# Patient Record
Sex: Male | Born: 1975 | Race: White | Hispanic: No | Marital: Single | Smoking: Current every day smoker
Health system: Southern US, Community
[De-identification: ages and names within clinical notes are randomized; demographics above are authoritative.]

## PROBLEM LIST (undated history)

## (undated) DIAGNOSIS — G4730 Sleep apnea, unspecified: Secondary | ICD-10-CM

## (undated) DIAGNOSIS — F419 Anxiety disorder, unspecified: Secondary | ICD-10-CM

## (undated) DIAGNOSIS — G4700 Insomnia, unspecified: Secondary | ICD-10-CM

## (undated) DIAGNOSIS — K589 Irritable bowel syndrome without diarrhea: Secondary | ICD-10-CM

## (undated) DIAGNOSIS — K219 Gastro-esophageal reflux disease without esophagitis: Secondary | ICD-10-CM

## (undated) DIAGNOSIS — N289 Disorder of kidney and ureter, unspecified: Secondary | ICD-10-CM

## (undated) HISTORY — PX: ESOPHAGOGASTRODUODENOSCOPY ENDOSCOPY: SHX5814

---

## 1997-12-21 ENCOUNTER — Encounter: Payer: Self-pay | Admitting: Emergency Medicine

## 1997-12-21 ENCOUNTER — Emergency Department (HOSPITAL_COMMUNITY): Admission: EM | Admit: 1997-12-21 | Discharge: 1997-12-21 | Payer: Self-pay | Admitting: Emergency Medicine

## 1998-10-01 ENCOUNTER — Emergency Department (HOSPITAL_COMMUNITY): Admission: EM | Admit: 1998-10-01 | Discharge: 1998-10-01 | Payer: Self-pay | Admitting: *Deleted

## 1999-10-09 ENCOUNTER — Emergency Department (HOSPITAL_COMMUNITY): Admission: EM | Admit: 1999-10-09 | Discharge: 1999-10-09 | Payer: Self-pay

## 2003-04-15 ENCOUNTER — Emergency Department (HOSPITAL_COMMUNITY): Admission: EM | Admit: 2003-04-15 | Discharge: 2003-04-15 | Payer: Self-pay | Admitting: Emergency Medicine

## 2014-02-16 ENCOUNTER — Emergency Department (HOSPITAL_BASED_OUTPATIENT_CLINIC_OR_DEPARTMENT_OTHER): Payer: Self-pay

## 2014-02-16 ENCOUNTER — Emergency Department (HOSPITAL_BASED_OUTPATIENT_CLINIC_OR_DEPARTMENT_OTHER)
Admission: EM | Admit: 2014-02-16 | Discharge: 2014-02-16 | Disposition: A | Discharge status: Home / Self Care | Payer: Self-pay | Attending: Emergency Medicine | Admitting: Emergency Medicine

## 2014-02-16 ENCOUNTER — Encounter (HOSPITAL_BASED_OUTPATIENT_CLINIC_OR_DEPARTMENT_OTHER): Payer: Self-pay | Admitting: Emergency Medicine

## 2014-02-16 DIAGNOSIS — K5909 Other constipation: Secondary | ICD-10-CM | POA: Insufficient documentation

## 2014-02-16 DIAGNOSIS — Z720 Tobacco use: Secondary | ICD-10-CM | POA: Insufficient documentation

## 2014-02-16 DIAGNOSIS — K5904 Chronic idiopathic constipation: Secondary | ICD-10-CM

## 2014-02-16 DIAGNOSIS — R109 Unspecified abdominal pain: Secondary | ICD-10-CM

## 2014-02-16 LAB — URINALYSIS, ROUTINE W REFLEX MICROSCOPIC
Bilirubin Urine: NEGATIVE
Glucose, UA: NEGATIVE mg/dL
Hgb urine dipstick: NEGATIVE
Ketones, ur: NEGATIVE mg/dL
Leukocytes, UA: NEGATIVE
Nitrite: NEGATIVE
Protein, ur: NEGATIVE mg/dL
Specific Gravity, Urine: 1.015 (ref 1.005–1.030)
Urobilinogen, UA: 0.2 mg/dL (ref 0.0–1.0)
pH: 7.5 (ref 5.0–8.0)

## 2014-02-16 MED ORDER — GI COCKTAIL ~~LOC~~
30.0000 mL | Freq: Once | ORAL | Status: AC
  Administered 2014-02-16: 30 mL via ORAL
  Filled 2014-02-16: qty 30

## 2014-02-16 NOTE — ED Notes (Signed)
C/o pressure/discomfort  to left side between rib and waist x 1 week  Denies inj

## 2014-02-16 NOTE — ED Notes (Signed)
Patient states that he feels like he has "a blob" in his left upper and lower quad. Patient states that he feels "like I have to poop"

## 2014-02-16 NOTE — ED Provider Notes (Signed)
CSN: [id number]     Arrival date & time 02/16/14  0217 History   First MD Initiated Contact with Patient 02/16/14 0301     Chief Complaint  Patient presents with  . Abdominal Pain     (Consider location/radiation/quality/duration/timing/severity/associated sxs/prior Treatment) Patient is a 39 y.o. male presenting with abdominal pain. The history is provided by the patient.  Abdominal Pain Pain location:  LLQ Pain quality: bloating and cramping   Pain radiates to:  Does not radiate Pain severity:  Moderate Onset quality:  Gradual Timing:  Constant Progression:  Worsening Chronicity:  New Context: retching   Relieved by:  Nothing Worsened by:  Nothing tried Ineffective treatments:  None tried Associated symptoms: no diarrhea, no fever and no vomiting   Associated symptoms comment:  Small rock hard stools Risk factors: has not had multiple surgeries     History reviewed. No pertinent past medical history. History reviewed. No pertinent past surgical history. History reviewed. No pertinent family history. History  Substance Use Topics  . Smoking status: Current Every Day Smoker  . Smokeless tobacco: Not on file  . Alcohol Use: No    Review of Systems  Constitutional: Negative for fever.  Gastrointestinal: Positive for abdominal pain. Negative for vomiting and diarrhea.  All other systems reviewed and are negative.     Allergies  Review of patient's allergies indicates no known allergies.  Home Medications   Prior to Admission medications   Not on File   BP 133/91 mmHg  Pulse 66  Temp(Src) 97.9 F (36.6 C) (Oral)  Resp 20  SpO2 98% Physical Exam  Constitutional: He is oriented to person, place, and time. He appears well-developed and well-nourished. No distress.  HENT:  Head: Normocephalic and atraumatic.  Mouth/Throat: Oropharynx is clear and moist.  Eyes: Conjunctivae are normal. Pupils are equal, round, and reactive to light.  Neck: Normal range of  motion. Neck supple.  Cardiovascular: Normal rate, regular rhythm and intact distal pulses.   Pulmonary/Chest: Effort normal and breath sounds normal. No respiratory distress. He has no wheezes. He has no rales.  Abdominal: Soft. He exhibits no mass. Bowel sounds are increased. There is no tenderness. There is no rigidity, no rebound, no guarding, no tenderness at McBurney's point and negative Murphy's sign.  Stool palpable  Musculoskeletal: Normal range of motion.  Neurological: He is alert and oriented to person, place, and time.  Skin: Skin is warm.  Psychiatric: He has a normal mood and affect.    ED Course  Procedures (including critical care time) Labs Review Labs Reviewed  URINALYSIS, ROUTINE W REFLEX MICROSCOPIC    Imaging Review Dg Abd Acute W/chest  02/16/2014   CLINICAL DATA:  Pressure and discomfort to the left side between the rib and waist for 1 week.  EXAM: ACUTE ABDOMEN SERIES (ABDOMEN 2 VIEW & CHEST 1 VIEW)  COMPARISON:  Chest 10/31/2012  FINDINGS: There is no evidence of dilated bowel loops or free intraperitoneal air. No radiopaque calculi or other significant radiographic abnormality is seen. Heart size and mediastinal contours are within normal limits. Both lungs are clear.  IMPRESSION: Negative abdominal radiographs.  No acute cardiopulmonary disease.   Electronically Signed   By: Burman Nieves M.D.   On: 02/16/2014 03:34     EKG Interpretation None      MDM   Final diagnoses:  Abdominal pain, acute  Sound asleep in room on reevaluation  Gas throughout the colon and stool, some hard on acute abdominal. Will treat for  gas and constipation with colace and miralax.  Increase fiber in fiber and increased water.  Patient verbalizes understanding and agrees to follow up    Airianna Kreischer Smitty CordsK Kamiya Acord-Rasch, MD 02/16/14 [id number]

## 2014-02-16 NOTE — Discharge Instructions (Signed)

## 2014-06-23 ENCOUNTER — Emergency Department (HOSPITAL_BASED_OUTPATIENT_CLINIC_OR_DEPARTMENT_OTHER)
Admission: EM | Admit: 2014-06-23 | Discharge: 2014-06-23 | Disposition: A | Discharge status: Home / Self Care | Payer: Self-pay | Attending: Emergency Medicine | Admitting: Emergency Medicine

## 2014-06-23 ENCOUNTER — Encounter (HOSPITAL_BASED_OUTPATIENT_CLINIC_OR_DEPARTMENT_OTHER): Payer: Self-pay | Admitting: *Deleted

## 2014-06-23 DIAGNOSIS — R112 Nausea with vomiting, unspecified: Secondary | ICD-10-CM | POA: Insufficient documentation

## 2014-06-23 DIAGNOSIS — K5900 Constipation, unspecified: Secondary | ICD-10-CM | POA: Insufficient documentation

## 2014-06-23 DIAGNOSIS — R197 Diarrhea, unspecified: Secondary | ICD-10-CM | POA: Insufficient documentation

## 2014-06-23 DIAGNOSIS — Z720 Tobacco use: Secondary | ICD-10-CM | POA: Insufficient documentation

## 2014-06-23 LAB — BASIC METABOLIC PANEL
Anion gap: 9 (ref 5–15)
BUN: 12 mg/dL (ref 6–20)
CO2: 25 mmol/L (ref 22–32)
Calcium: 8.8 mg/dL — ABNORMAL LOW (ref 8.9–10.3)
Chloride: 102 mmol/L (ref 101–111)
Creatinine, Ser: 1.05 mg/dL (ref 0.61–1.24)
GFR calc Af Amer: >60 mL/min (ref >60)
GFR calc non Af Amer: >60 mL/min (ref >60)
Glucose, Bld: 124 mg/dL — ABNORMAL HIGH (ref 65–99)
Potassium: 3.5 mmol/L (ref 3.5–5.1)
Sodium: 136 mmol/L (ref 135–145)

## 2014-06-23 LAB — CBC WITH DIFFERENTIAL/PLATELET
Basophils Absolute: 0 10*3/uL (ref 0.0–0.1)
Basophils Relative: 0 % (ref 0–1)
Eosinophils Absolute: 0 10*3/uL (ref 0.0–0.7)
Eosinophils Relative: 0 % (ref 0–5)
HCT: 39.5 % (ref 39.0–52.0)
Hemoglobin: 14.2 g/dL (ref 13.0–17.0)
Lymphocytes Relative: 12 % (ref 12–46)
Lymphs Abs: 0.6 10*3/uL — ABNORMAL LOW (ref 0.7–4.0)
MCH: 31.3 pg (ref 26.0–34.0)
MCHC: 35.9 g/dL (ref 30.0–36.0)
MCV: 87 fL (ref 78.0–100.0)
Monocytes Absolute: 0.5 10*3/uL (ref 0.1–1.0)
Monocytes Relative: 10 % (ref 3–12)
Neutro Abs: 3.9 10*3/uL (ref 1.7–7.7)
Neutrophils Relative %: 78 % — ABNORMAL HIGH (ref 43–77)
Platelets: 191 10*3/uL (ref 150–400)
RBC: 4.54 MIL/uL (ref 4.22–5.81)
RDW: 12.3 % (ref 11.5–15.5)
WBC: 5 10*3/uL (ref 4.0–10.5)

## 2014-06-23 LAB — URINALYSIS, ROUTINE W REFLEX MICROSCOPIC
Glucose, UA: NEGATIVE mg/dL
Hgb urine dipstick: NEGATIVE
Ketones, ur: NEGATIVE mg/dL
Leukocytes, UA: NEGATIVE
Nitrite: NEGATIVE
Protein, ur: 30 mg/dL — AB
Specific Gravity, Urine: 1.04 — ABNORMAL HIGH (ref 1.005–1.030)
Urobilinogen, UA: 1 mg/dL (ref 0.0–1.0)
pH: 5.5 (ref 5.0–8.0)

## 2014-06-23 LAB — URINE MICROSCOPIC-ADD ON

## 2014-06-23 MED ORDER — HYDROMORPHONE HCL 1 MG/ML IJ SOLN
1.0000 mg | Freq: Once | INTRAMUSCULAR | Status: DC
  Filled 2014-06-23: qty 1

## 2014-06-23 MED ORDER — SODIUM CHLORIDE 0.9 % IV BOLUS (SEPSIS)
1000.0000 mL | Freq: Once | INTRAVENOUS | Status: AC
  Administered 2014-06-23: 1000 mL via INTRAVENOUS

## 2014-06-23 MED ORDER — ONDANSETRON HCL 4 MG PO TABS: 4.0000 mg | ORAL_TABLET | Freq: Four times a day (QID) | ORAL | Status: DC

## 2014-06-23 MED ORDER — ONDANSETRON HCL 4 MG/2ML IJ SOLN
4.0000 mg | Freq: Once | INTRAMUSCULAR | Status: AC
  Administered 2014-06-23: 4 mg via INTRAVENOUS
  Filled 2014-06-23: qty 2

## 2014-06-23 NOTE — Discharge Instructions (Signed)
Diarrhea °Diarrhea is watery poop (stool). It can make you feel weak, tired, thirsty, or give you a dry mouth (signs of dehydration). Watery poop is a sign of another problem, most often an infection. It often lasts 2-3 days. It can last longer if it is a sign of something serious. Take care of yourself as told by your doctor. °HOME CARE  °· Drink 1 cup (8 ounces) of fluid each time you have watery poop. °· Do not drink the following fluids: °¨ Those that contain simple sugars (fructose, glucose, galactose, lactose, sucrose, maltose). °¨ Sports drinks. °¨ Fruit juices. °¨ Whole milk products. °¨ Sodas. °¨ Drinks with caffeine (coffee, tea, soda) or alcohol. °· Oral rehydration solution may be used if the doctor says it is okay. You may make your own solution. Follow this recipe: °¨  - teaspoon table salt. °¨ ¾ teaspoon baking soda. °¨  teaspoon salt substitute containing potassium chloride. °¨ 1 tablespoons sugar. °¨ 1 liter (34 ounces) of water. °· Avoid the following foods: °¨ High fiber foods, such as raw fruits and vegetables. °¨ Nuts, seeds, and whole grain breads and cereals. °¨  Those that are sweetened with sugar alcohols (xylitol, sorbitol, mannitol). °· Try eating the following foods: °¨ Starchy foods, such as rice, toast, pasta, low-sugar cereal, oatmeal, baked potatoes, crackers, and bagels. °¨ Bananas. °¨ Applesauce. °· Eat probiotic-rich foods, such as yogurt and milk products that are fermented. °· Wash your hands well after each time you have watery poop. °· Only take medicine as told by your doctor. °· Take a warm bath to help lessen burning or pain from having watery poop. °GET HELP RIGHT AWAY IF:  °· You cannot drink fluids without throwing up (vomiting). °· You keep throwing up. °· You have blood in your poop, or your poop looks black and tarry. °· You do not pee (urinate) in 6-8 hours, or there is only a small amount of very dark pee. °· You have belly (abdominal) pain that gets worse or stays  in the same spot (localizes). °· You are weak, dizzy, confused, or light-headed. °· You have a very bad headache. °· Your watery poop gets worse or does not get better. °· You have a fever or lasting symptoms for more than 2-3 days. °· You have a fever and your symptoms suddenly get worse. °MAKE SURE YOU:  °· Understand these instructions. °· Will watch your condition. °· Will get help right away if you are not doing well or get worse. °Document Released: 07/16/2007 Document Revised: 06/13/2013 Document Reviewed: 10/05/2011 °ExitCare® Patient Information ©2015 [employer]. This information is not intended to replace advice given to you by your health care provider. Make sure you discuss any questions you have with your health care provider. ° °Nausea and Vomiting °Nausea means you feel sick to your stomach. Throwing up (vomiting) is a reflex where stomach contents come out of your mouth. °HOME CARE  °· Take medicine as told by your doctor. °· Do not force yourself to eat. However, you do need to drink fluids. °· If you feel like eating, eat a normal diet as told by your doctor. °¨ Eat rice, wheat, potatoes, bread, lean meats, yogurt, fruits, and vegetables. °¨ Avoid high-fat foods. °· Drink enough fluids to keep your pee (urine) clear or pale yellow. °· Ask your doctor how to replace body fluid losses (rehydrate). Signs of body fluid loss (dehydration) include: °¨ Feeling very thirsty. °¨ Dry lips and mouth. °¨ Feeling dizzy. °¨   Dark pee. °¨ Peeing less than normal. °¨ Feeling confused. °¨ Fast breathing or heart rate. °GET HELP RIGHT AWAY IF:  °· You have blood in your throw up. °· You have black or bloody poop (stool). °· You have a bad headache or stiff neck. °· You feel confused. °· You have bad belly (abdominal) pain. °· You have chest pain or trouble breathing. °· You do not pee at least once every 8 hours. °· You have cold, clammy skin. °· You keep throwing up after 24 to 48 hours. °· You have a  fever. °MAKE SURE YOU:  °· Understand these instructions. °· Will watch your condition. °· Will get help right away if you are not doing well or get worse. °Document Released: 07/16/2007 Document Revised: 04/21/2011 Document Reviewed: 06/28/2010 °ExitCare® Patient Information ©2015 [employer]. This information is not intended to replace advice given to you by your health care provider. Make sure you discuss any questions you have with your health care provider. ° °

## 2014-06-23 NOTE — ED Notes (Signed)
Patient reports pain is not bad enough to need dilaudid at present.  Patient refusing pain medication at this time.

## 2014-06-23 NOTE — ED Notes (Signed)
Pt sts he felt constipated yesterday so eat took some exlax. He sts that afterward, he began vomiting and with diarrhea. Today the diarrhea persists and he has a HA.

## 2014-06-23 NOTE — ED Provider Notes (Signed)
CSN: [id number]     Arrival date & time 06/23/14  2046 History  This chart was scribed for Raeford RazorStephen Scout Guyett, MD by Octavia HeirArianna Nassar, ED Scribe. This patient was seen in room MH02/MH02 and the patient's care was started at 10:30 PM.     Chief Complaint  Patient presents with  . Diarrhea    The history is provided by the patient. No language interpreter was used.     HPI Comments: Dalton Fuentes is a 39 y.o. male who presents to the Emergency Department complaining of intermittent diarrhea that began last night. Patient felt as if there was a brick in his chest and suspected he was constipated and took exlax for his symptoms and subsequently began vomiting and having diarrhea. Patient had associated diaphoresis and headaches. No aggravating or alleviating factors noted. Patient denies abdominal pain, urinary dysfunction, hematuria, bloody stools, changes in vision, numbness, and tingling sensation.    History reviewed. No pertinent past medical history. History reviewed. No pertinent past surgical history. No family history on file. History  Substance Use Topics  . Smoking status: Current Every Day Smoker  . Smokeless tobacco: Not on file  . Alcohol Use: No    Review of Systems  Eyes: Negative for visual disturbance.  Gastrointestinal: Positive for nausea, vomiting, diarrhea and constipation. Negative for abdominal pain and blood in stool.  Genitourinary: Negative for dysuria, hematuria and difficulty urinating.  Neurological: Negative for numbness.  All other systems reviewed and are negative.     Allergies  Review of patient's allergies indicates no known allergies.  Home Medications   Prior to Admission medications   Not on File   Triage vitals: BP 121/88 mmHg  Pulse 73  Temp(Src) 98.5 F (36.9 C) (Oral)  Resp 18  Ht 5\' 8"  (1.727 m)  Wt 175 lb (79.379 kg)  BMI 26.61 kg/m2  SpO2 100% Physical Exam  Constitutional: He appears well-developed and well-nourished. No  distress.  HENT:  Head: Normocephalic and atraumatic.  Eyes: Right eye exhibits no discharge. Left eye exhibits no discharge.  Cardiovascular: Normal rate, regular rhythm and normal heart sounds.   Pulmonary/Chest: Effort normal and breath sounds normal. No respiratory distress.  Abdominal: Soft. He exhibits no distension. There is no tenderness.  Neurological: He is alert. Coordination normal.  Skin: No rash noted. He is not diaphoretic.  Psychiatric: He has a normal mood and affect. His behavior is normal.  Nursing note and vitals reviewed.   ED Course  Procedures   DIAGNOSTIC STUDIES: Oxygen Saturation is 100% on RA, normal by my interpretation.  COORDINATION OF CARE: 10:33 PM Will order IV and blood work. Patient agrees.   Labs Review Labs Reviewed  URINALYSIS, ROUTINE W REFLEX MICROSCOPIC - Abnormal; Notable for the following:    Color, Urine AMBER (*)    APPearance CLOUDY (*)    Specific Gravity, Urine 1.040 (*)    Bilirubin Urine SMALL (*)    Protein, ur 30 (*)    All other components within normal limits  BASIC METABOLIC PANEL - Abnormal; Notable for the following:    Glucose, Bld 124 (*)    Calcium 8.8 (*)    All other components within normal limits  CBC WITH DIFFERENTIAL/PLATELET - Abnormal; Notable for the following:    Neutrophils Relative % 78 (*)    Lymphs Abs 0.6 (*)    All other components within normal limits  URINE MICROSCOPIC-ADD ON    Imaging Review No results found.   EKG Interpretation None  MDM   Final diagnoses:  Nausea vomiting and diarrhea    It has been determined that no acute conditions requiring further emergency intervention are present at this time. The patient has been advised of the diagnosis and plan. I reviewed any labs and imaging including any potential incidental findings. We have discussed signs and symptoms that warrant return to the ED and they are listed in the discharge instructions.   I personally preformed  the services scribed in my presence. The recorded information has been reviewed is accurate. Raeford RazorStephen Hilmar Moldovan, MD.     Raeford RazorStephen Orley Lawry, MD 06/28/14 [phone]

## 2014-06-23 NOTE — ED Notes (Signed)
MD at bedside. 

## 2015-07-09 ENCOUNTER — Encounter (HOSPITAL_BASED_OUTPATIENT_CLINIC_OR_DEPARTMENT_OTHER): Payer: Self-pay

## 2015-07-09 ENCOUNTER — Emergency Department (HOSPITAL_BASED_OUTPATIENT_CLINIC_OR_DEPARTMENT_OTHER)
Admission: EM | Admit: 2015-07-09 | Discharge: 2015-07-09 | Disposition: A | Discharge status: Home / Self Care | Payer: Self-pay | Attending: Emergency Medicine | Admitting: Emergency Medicine

## 2015-07-09 DIAGNOSIS — F410 Panic disorder [episodic paroxysmal anxiety] without agoraphobia: Secondary | ICD-10-CM | POA: Insufficient documentation

## 2015-07-09 DIAGNOSIS — F17200 Nicotine dependence, unspecified, uncomplicated: Secondary | ICD-10-CM | POA: Insufficient documentation

## 2015-07-09 MED ORDER — LORAZEPAM 1 MG PO TABS: 1.0000 mg | ORAL_TABLET | Freq: Every evening | ORAL | Status: DC | PRN

## 2015-07-09 NOTE — ED Notes (Signed)
Pt c/o intermittent trouble sleeping x 2 years-wakes up "in a panic and i can't sleep"-NAD-steady gait

## 2015-07-09 NOTE — ED Provider Notes (Signed)
CSN: [id number]     Arrival date & time 07/09/15  2223 History  By signing my name below, I, Dalton Fuentes, attest that this documentation has been prepared under the direction and in the presence of Dalton Libra, MD. Electronically Signed: Doreatha Fuentes, ED Scribe. 07/09/2015. 11:05 PM.     Chief Complaint  Patient presents with  . Insomnia   The history is provided by the patient. No language interpreter was used.    HPI Comments: Dalton Fuentes is a 40 y.o. male who presents to the Emergency Department complaining of intermittent insomnia for 2 years, most recently occurring tonight. Pt states typically he is able to sleep for approximately half an hour before waking up suddenly with a sense of impending doom. This sense of doom is severe enough to prevent him from wanting to go back to sleep. These episodes may last for several days and then abate for several months. He denies CP, SOB, hyperventilation, numbness, tingling or nausea upon waking up. He does notes he had one episode of emesis after waking up this evening, but states this is not typical. He denies any similar episodes during waking hours. There is no known trigger for these episodes. He never has them during waking hours. Per pt, he snores but is not sure if he has any apneic episodes while sleeping. He admits to marijuana use, but denies any additional drug use. Pt is not currently followed by a PCP.    History reviewed. No pertinent past medical history. No past surgical history on file. No family history on file. Social History  Substance Use Topics  . Smoking status: Current Every Day Smoker  . Smokeless tobacco: None  . Alcohol Use: Yes     Comment: occ    Review of Systems A complete 10 system review of systems was obtained and all systems are negative except as noted in the HPI and PMH.     Allergies  Review of patient's allergies indicates no known allergies.  Home Medications   Prior to Admission medications    Medication Sig Start Date End Date Taking? Authorizing Provider  LORazepam (ATIVAN) 1 MG tablet Take 1 tablet (1 mg total) by mouth at bedtime as needed for anxiety or sleep. 07/09/15   Dalton Polan, MD   BP 119/77 mmHg  Pulse 72  Temp(Src) 98.7 F (37.1 C) (Oral)  Resp 20  Ht  (1.727 m)  Wt 180 lb (81.647 kg)  BMI 27.38 kg/m2  SpO2 98%   Physical Exam  General: Well-developed, well-nourished male in no acute distress; appearance consistent with age of record HENT: normocephalic; atraumatic Eyes: pupils equal, round and reactive to light; extraocular muscles intact Neck: supple Heart: regular rate and rhythm Lungs: clear to auscultation bilaterally Abdomen: soft; nondistended; nontender; bowel sounds present Extremities: No deformity; full range of motion Neurologic: Awake, alert and oriented; motor function intact in all extremities and symmetric; no facial droop Skin: Warm and dry Psychiatric: Mildly anxious  ED Course  Procedures (including critical care time) DIAGNOSTIC STUDIES: Oxygen Saturation is 98% on RA, normal by my interpretation.    COORDINATION OF CARE: 11:01 PM Discussed treatment plan with pt at bedside which includes psychiatry f/u, rx for Ativan and pt agreed to plan.    MDM   Final diagnoses:  Panic attacks   I personally performed the services described in this documentation, which was scribed in my presence. The recorded information has been reviewed and is accurate.    Dalton Fuentes  Dalton Nygard, MD 07/09/15 2312

## 2015-07-09 NOTE — ED Notes (Signed)
EDP into room 

## 2015-07-09 NOTE — ED Notes (Signed)
No changes, no dyspnea noted, alert, NAD, calm

## 2015-07-09 NOTE — Discharge Instructions (Signed)
Panic Attacks °Panic attacks are sudden, short-lived surges of severe anxiety, fear, or discomfort. They may occur for no reason when you are relaxed, when you are anxious, or when you are sleeping. Panic attacks may occur for a number of reasons:  °· Healthy people occasionally have panic attacks in extreme, life-threatening situations, such as war or natural disasters. Normal anxiety is a protective mechanism of the body that helps us react to danger (fight or flight response). °· Panic attacks are often seen with anxiety disorders, such as panic disorder, social anxiety disorder, generalized anxiety disorder, and phobias. Anxiety disorders cause excessive or uncontrollable anxiety. They may interfere with your relationships or other life activities. °· Panic attacks are sometimes seen with other mental illnesses, such as depression and posttraumatic stress disorder. °· Certain medical conditions, prescription medicines, and drugs of abuse can cause panic attacks. °SYMPTOMS  °Panic attacks start suddenly, peak within 20 minutes, and are accompanied by four or more of the following symptoms: °· Pounding heart or fast heart rate (palpitations). °· Sweating. °· Trembling or shaking. °· Shortness of breath or feeling smothered. °· Feeling choked. °· Chest pain or discomfort. °· Nausea or strange feeling in your stomach. °· Dizziness, light-headedness, or feeling like you will faint. °· Chills or hot flushes. °· Numbness or tingling in your lips or hands and feet. °· Feeling that things are not real or feeling that you are not yourself. °· Fear of losing control or going crazy. °· Fear of dying. °Some of these symptoms can mimic serious medical conditions. For example, you may think you are having a heart attack. Although panic attacks can be very scary, they are not life threatening. °DIAGNOSIS  °Panic attacks are diagnosed through an assessment by your health care provider. Your health care provider will ask  questions about your symptoms, such as where and when they occurred. Your health care provider will also ask about your medical history and use of alcohol and drugs, including prescription medicines. Your health care provider may order blood tests or other studies to rule out a serious medical condition. Your health care provider may refer you to a mental health professional for further evaluation. °TREATMENT  °· Most healthy people who have one or two panic attacks in an extreme, life-threatening situation will not require treatment. °· The treatment for panic attacks associated with anxiety disorders or other mental illness typically involves counseling with a mental health professional, medicine, or a combination of both. Your health care provider will help determine what treatment is best for you. °· Panic attacks due to physical illness usually go away with treatment of the illness. If prescription medicine is causing panic attacks, talk with your health care provider about stopping the medicine, decreasing the dose, or substituting another medicine. °· Panic attacks due to alcohol or drug abuse go away with abstinence. Some adults need professional help in order to stop drinking or using drugs. °HOME CARE INSTRUCTIONS  °· Take all medicines as directed by your health care provider.   °· Schedule and attend follow-up visits as directed by your health care provider. It is important to keep all your appointments. °SEEK MEDICAL CARE IF: °· You are not able to take your medicines as prescribed. °· Your symptoms do not improve or get worse. °SEEK IMMEDIATE MEDICAL CARE IF:  °· You experience panic attack symptoms that are different than your usual symptoms. °· You have serious thoughts about hurting yourself or others. °· You are taking medicine for panic attacks and   have a serious side effect. MAKE SURE YOU:  Understand these instructions.  Will watch your condition.  Will get help right away if you are not  doing well or get worse.   This information is not intended to replace advice given to you by your health care provider. Make sure you discuss any questions you have with your health care provider.   Document Released: 01/27/2005 Document Revised: 02/01/2013 Document Reviewed: 09/10/2012 Elsevier Interactive Patient Education 2016 [employer] 0

## 2015-07-09 NOTE — ED Notes (Signed)
Recent and episodic sleep disturbances decribed as waking in a panic, and afraid to go back to sleep. (denies: physical sx), alert, NAD,calm, interactive.

## 2015-08-22 ENCOUNTER — Emergency Department (HOSPITAL_BASED_OUTPATIENT_CLINIC_OR_DEPARTMENT_OTHER)
Admission: EM | Admit: 2015-08-22 | Discharge: 2015-08-22 | Disposition: A | Discharge status: Home / Self Care | Payer: Self-pay | Attending: Emergency Medicine | Admitting: Emergency Medicine

## 2015-08-22 ENCOUNTER — Encounter (HOSPITAL_BASED_OUTPATIENT_CLINIC_OR_DEPARTMENT_OTHER): Payer: Self-pay | Admitting: *Deleted

## 2015-08-22 DIAGNOSIS — K0889 Other specified disorders of teeth and supporting structures: Secondary | ICD-10-CM

## 2015-08-22 DIAGNOSIS — F17210 Nicotine dependence, cigarettes, uncomplicated: Secondary | ICD-10-CM | POA: Insufficient documentation

## 2015-08-22 DIAGNOSIS — K0401 Reversible pulpitis: Secondary | ICD-10-CM

## 2015-08-22 DIAGNOSIS — K029 Dental caries, unspecified: Secondary | ICD-10-CM | POA: Insufficient documentation

## 2015-08-22 MED ORDER — PENICILLIN V POTASSIUM 250 MG PO TABS
500.0000 mg | ORAL_TABLET | Freq: Once | ORAL | Status: AC
  Administered 2015-08-22: 500 mg via ORAL
  Filled 2015-08-22: qty 2

## 2015-08-22 MED ORDER — KETOROLAC TROMETHAMINE 30 MG/ML IJ SOLN
30.0000 mg | Freq: Once | INTRAMUSCULAR | Status: AC
  Administered 2015-08-22: 30 mg via INTRAMUSCULAR
  Filled 2015-08-22: qty 1

## 2015-08-22 MED ORDER — PENICILLIN V POTASSIUM 500 MG PO TABS: 500.0000 mg | ORAL_TABLET | Freq: Four times a day (QID) | ORAL | Status: AC

## 2015-08-22 NOTE — ED Notes (Signed)
Pt c/o dental pain x 1 day  

## 2015-08-22 NOTE — ED Provider Notes (Signed)
CSN: [id number]     Arrival date & time 08/22/15  2116 History  By signing my name below, I, Dalton Fuentes, attest that this documentation has been prepared under the direction and in the presence of Laurence Spates, MD. Electronically Signed: Rosario Fuentes, ED Scribe. 08/22/2015. 10:46 PM.   Chief Complaint  Patient presents with  . Dental Pain   The history is provided by the patient. No language interpreter was used.   HPI Comments: Dalton Fuentes is a 40 y.o. male who presents to the Emergency Department complaining of gradual onset, gradually worsening, constant lower right-sided dental pain x 1 day. He has associated facial swelling to the area of pain. Pt has a hx of similar dental pain on the opposite side of his mouth ~8 months ago, and received a Penicillin shot from UC which completely resolved his symptoms. His pain is worsened chewing and palpation. He took 3-4 doses of his left over Amoxicillin over the past couple of days with no relief of his symptoms. He has also been taking  Ibuprofen as well with no relief of his pain. He is not currently followed by a dentist. Pt smokes 1 pack per three days. He denies fever, vomiting, nausea, or any other symptoms.  History reviewed. No pertinent past medical history. History reviewed. No pertinent past surgical history. History reviewed. No pertinent family history. Social History  Substance Use Topics  . Smoking status: Current Every Day Smoker -- 0.50 packs/day    Types: Cigarettes  . Smokeless tobacco: None  . Alcohol Use: Yes     Comment: occ    Review of Systems A complete 10 system review of systems was obtained and all systems are negative except as noted in the HPI and PMH.   Allergies  Review of patient's allergies indicates no known allergies.  Home Medications   Prior to Admission medications   Medication Sig Start Date End Date Taking? Authorizing Provider  LORazepam (ATIVAN) 1 MG tablet  Take 1 tablet (1 mg total) by mouth at bedtime as needed for anxiety or sleep. 07/09/15   John Molpus, MD  penicillin v potassium (VEETID) 500 MG tablet Take 1 tablet (500 mg total) by mouth 4 (four) times daily. 08/22/15 08/29/15  Ambrose Finland Hollee Fate, MD   BP 110/63 mmHg  Pulse 72  Temp(Src) 97.9 F (36.6 C)  Resp 16  Ht  (1.727 m)  Wt 180 lb (81.647 kg)  BMI 27.38 kg/m2  SpO2 97%   Physical Exam  Constitutional: He is oriented to person, place, and time. He appears well-developed and well-nourished. No distress.  HENT:  Head: Normocephalic and atraumatic.  Nose: Nose normal.  Mouth/Throat: Oropharynx is clear and moist.    Tenderness of R lower teeth with multiple teeth broken to gumline and multiple caries; no mucosal swelling or fluctuance  Eyes: Conjunctivae are normal.  Neck: Neck supple.  Lymphadenopathy:    He has no cervical adenopathy.  Neurological: He is alert and oriented to person, place, and time.  Skin: Skin is warm and dry.  Psychiatric: He has a normal mood and affect. Judgment normal.  Nursing note and vitals reviewed.  ED Course  Procedures (including critical care time)  DIAGNOSTIC STUDIES: Oxygen Saturation is 98% on RA, normal by my interpretation.   COORDINATION OF CARE: 10:45 PM-Discussed next steps with pt including antibiotics, a Toradol shot, and a recommendation for a f/u w/ a dental specialist. Pt verbalized understanding and is agreeable with  the plan.   MDM   Final diagnoses:  Pulpitis  Pain, dental   Patient with 1 day of dental pain, multiple broken teeth and dental caries on exam. No mucosal swelling  or fluctuance to suggest abscess. No obvious facial swelling. Gave penicillin, shot of Toradol, and emphasized importance of following with a dentist as soon as possible. Reviewed return precautions including significant facial swelling, worsening for pain, or fever. Patient voiced understanding.  I personally performed the services  described in this documentation, which was scribed in my presence. The recorded information has been reviewed and is accurate.    Laurence Spatesachel Morgan Ioana Louks, MD 08/23/15 [phone]

## 2015-08-22 NOTE — Discharge Instructions (Signed)
Community Resource Guide Dental °The [employer]’s “211” is a great source of information about community services available.  Access by dialing 2-1-1 from anywhere in [place], or by website -  www.nc211.org.  ° °Other Local Resources (Updated 02/2015) ° °Dental  Care °  °Services ° °  °Phone Number and Address  °Cost  °[facility] For children 0 - 42 years of age:  °• Cleaning °• Tooth brushing/flossing instruction °• Sealants, fillings, crowns °• Extractions °• Emergency treatment  [phone] °[address] °[place], [place] [address] Charges based on family income.  Medicaid and some insurance plans accepted.   °  °[facility] [place] • Cleaning °• Sealants, fillings, crowns °• Extractions °• Emergency treatment [phone] °[address] °[place], [place] ° Pregnant women 18 years of age or older with a Medicaid card  °[facility] [place] • Cleaning °• Sealants, fillings, crowns °• Extractions °• Emergency treatment [phone] °[address] °[place], [place] Pregnant women 18 years of age or older with a Medicaid card  °[facility] For children 0 - 42 years of age:  °• Cleaning °• Tooth brushing/flossing instruction °• Sealants, fillings, crowns °• Extractions °• Emergency treatment °Limited orthodontic services for patients with Medicaid [phone] °[address] °[place], [place] [address] Medicaid and [place] Health Choice cover for children up to age 42 and pregnant women.  Parents of children up to age 42 without Medicaid pay a reduced fee at time of service.  °[facility] [place] For children 0 - 42 years of age:  °• Cleaning °• Tooth brushing/flossing instruction °• Sealants, fillings, crowns °• Extractions °• Emergency treatment °Limited orthodontic services for patients with Medicaid  [phone] °[address] °[place], [place].  Medicaid and [place] Health Choice cover for children up to age 42 and pregnant women.  Parents of children up to age 42 without Medicaid pay a reduced fee.  °[facility] • Cleaning °• Sealants, fillings, crowns °• Extractions ° °Hours: Tuesdays and Thursdays, 4:15 - 8 pm [phone] °[address] °[place], [place] [address] Services free of charge to  County residents ages 18-64 who do not have health insurance, Medicare, Medicaid, or VA benefits and fall within federal poverty guidelines  °[facility] ° ° ° Provides dental care in addition to primary medical care, nutritional counseling, and pharmacy: °• Cleaning °• Sealants, fillings, crowns °• Extractions ° ° ° ° ° ° ° ° ° ° ° ° ° ° ° ° ° [phone] °[facility], [address] °[place], [place] ° °[phone] °[facility], [address] [place], [place] ° °[phone] °[facility] °[place], [place] ° °[phone] °[facility], [address] °[place], [place] ° °[phone] °[facility] °[address] °[place], [place] Accepts Medicaid, Medicare, most insurance.  Also provides services available to all with fees adjusted based on ability to pay.    °[facility] • Cleaning °• Tooth brushing/flossing instruction °• Sealants, fillings, crowns °• Extractions °• Emergency treatment °Hours: Tuesdays, Thursdays, and Fridays from 8 am to 5 pm by appointment only. [phone] °[address] °[place], [place] [address] [place] residents with Medicaid (depending on eligibility) and children with [place] Health Choice - call for more information.  °  [facility] • Extractions only ° °Hours: 2nd and 4th Thursday of each month from 6:30 am - 9 am.   [phone] ext. [phone] °[address] °[place], [place] [address] Ages 18 and older only.  Patients are seen on a first come, first served basis.  °[facility] • Cleanings °• Fillings °• Extractions °• Orthodontics °• Endodontics °• Implants/Crowns/Bridges °• Complete and partial dentures [phone] °[place], [place] Patients must complete an application for services.  There is often a waiting list.   ° °

## 2015-12-02 ENCOUNTER — Emergency Department (HOSPITAL_BASED_OUTPATIENT_CLINIC_OR_DEPARTMENT_OTHER): Payer: Self-pay

## 2015-12-02 ENCOUNTER — Emergency Department (HOSPITAL_BASED_OUTPATIENT_CLINIC_OR_DEPARTMENT_OTHER)
Admission: EM | Admit: 2015-12-02 | Discharge: 2015-12-03 | Disposition: A | Discharge status: Home / Self Care | Payer: Self-pay | Attending: Emergency Medicine | Admitting: Emergency Medicine

## 2015-12-02 ENCOUNTER — Encounter (HOSPITAL_BASED_OUTPATIENT_CLINIC_OR_DEPARTMENT_OTHER): Payer: Self-pay | Admitting: Emergency Medicine

## 2015-12-02 DIAGNOSIS — K5900 Constipation, unspecified: Secondary | ICD-10-CM | POA: Insufficient documentation

## 2015-12-02 DIAGNOSIS — F17210 Nicotine dependence, cigarettes, uncomplicated: Secondary | ICD-10-CM | POA: Insufficient documentation

## 2015-12-02 LAB — URINALYSIS, ROUTINE W REFLEX MICROSCOPIC
Bilirubin Urine: NEGATIVE
Glucose, UA: NEGATIVE mg/dL
Hgb urine dipstick: NEGATIVE
KETONES UR: NEGATIVE mg/dL
LEUKOCYTES UA: NEGATIVE
Nitrite: NEGATIVE
PROTEIN: NEGATIVE mg/dL
Specific Gravity, Urine: 1.013 (ref 1.005–1.030)
pH: 6 (ref 5.0–8.0)

## 2015-12-02 MED ORDER — ONDANSETRON 8 MG PO TBDP
8.0000 mg | ORAL_TABLET | Freq: Once | ORAL | Status: AC
  Administered 2015-12-02: 8 mg via ORAL
  Filled 2015-12-02: qty 1

## 2015-12-02 MED ORDER — DICYCLOMINE HCL 10 MG/ML IM SOLN
20.0000 mg | Freq: Once | INTRAMUSCULAR | Status: AC
  Administered 2015-12-02: 20 mg via INTRAMUSCULAR
  Filled 2015-12-02: qty 2

## 2015-12-02 MED ORDER — GI COCKTAIL ~~LOC~~
30.0000 mL | Freq: Once | ORAL | Status: AC
  Administered 2015-12-02: 30 mL via ORAL
  Filled 2015-12-02: qty 30

## 2015-12-02 NOTE — ED Triage Notes (Signed)
Pt in c/o abd distension, bloating, and nausea x 2-3 hours. Is passing gas, has had two small formed BMs since feeling bad without relief. States is nauseous but has not vomited. Alert, interactive, ambulatory in NAD.

## 2015-12-02 NOTE — ED Provider Notes (Signed)
[facility]Y [facility]PT [facility] Provider Note   CSN: [id number] Arrival date & time: 12/02/15  2302  By signing my name below, I, Teofilo Pod, attest that this documentation has been prepared under the direction and in the presence of Lelaina Oatis, MD . Electronically Signed: Teofilo Pod, ED Scribe. 12/02/2015. 11:14 PM.   History   Chief Complaint Chief Complaint  Patient presents with  . Bloated  . Nausea    The history is provided by the patient. No language interpreter was used.  Abdominal Pain   This is a new problem. The current episode started 1 to 2 hours ago. The problem occurs constantly. The problem has not changed since onset.The pain is associated with eating. The pain is located in the LLQ (bloating and cramping). The pain is mild. Associated symptoms include flatus and constipation. Pertinent negatives include anorexia, fever, diarrhea, vomiting, dysuria, frequency and hematuria. Associated symptoms comments: Small, hard stools. Nothing aggravates the symptoms. Nothing relieves the symptoms. Past workup does not include GI consult. His past medical history does not include PUD, ulcerative colitis or Crohn's disease.   HPI Comments:  Dalton Fuentes is a 40 y.o. male who presents to the Emergency Department complaining of constant LLQ pain that began 2 hours ago. Pt describes the pain as "bloating and cramping." Pt reports associated small, hard stool today, but yesterday his stool was normal. Pt states that he ate sausage biscuits and pizza today. No alleviating factors noted. Pt denies dysuria, blood in stool, fever, congestion, sore throat.  History reviewed. No pertinent past medical history.  There are no active problems to display for this patient.   History reviewed. No pertinent surgical history.     Home Medications    Prior to Admission medications   Medication Sig Start Date End Date Taking? Authorizing Provider  LORazepam (ATIVAN) 1 MG tablet  Take 1 tablet (1 mg total) by mouth at bedtime as needed for anxiety or sleep. 07/09/15   Paula Libra, MD    Family History History reviewed. No pertinent family history.  Social History Social History  Substance Use Topics  . Smoking status: Current Every Day Smoker    Packs/day: 0.50    Types: Cigarettes  . Smokeless tobacco: Not on file  . Alcohol use Yes     Comment: occ     Allergies   Review of patient's allergies indicates no known allergies.   Review of Systems Review of Systems  Constitutional: Negative for fever.  HENT: Negative for congestion and sore throat.   Respiratory: Negative for shortness of breath.   Cardiovascular: Negative for chest pain.  Gastrointestinal: Positive for abdominal pain, constipation and flatus. Negative for anorexia, blood in stool, diarrhea and vomiting.  Genitourinary: Negative for dysuria, flank pain, frequency and hematuria.  All other systems reviewed and are negative.    Physical Exam Updated Vital Signs BP 109/92   Pulse 80   Temp 97.7 F (36.5 C)   Resp 18   Ht 5\' 8"  (1.727 m)   Wt 180 lb (81.6 kg)   SpO2 97%   BMI 27.37 kg/m   Physical Exam  Constitutional: He is oriented to person, place, and time. He appears well-developed and well-nourished. No distress.  HENT:  Head: Normocephalic and atraumatic.  Mouth/Throat: Oropharynx is clear and moist. No oropharyngeal exudate.  Trachea midline  Eyes: Conjunctivae and EOM are normal. Pupils are equal, round, and reactive to light.  Neck: Trachea normal and normal range of motion. Neck  supple. No JVD present. Carotid bruit is not present.  Cardiovascular: Normal rate and regular rhythm.  Exam reveals no gallop and no friction rub.   No murmur heard. PT pulse intact  Pulmonary/Chest: Effort normal and breath sounds normal. No stridor. He has no wheezes. He has no rales.  No stridor, no bruits  Abdominal: Soft. He exhibits no distension and no mass. Bowel sounds are  increased. There is no tenderness. There is no rebound, no guarding, no tenderness at McBurney's point and negative Murphy's sign. No hernia.  Hyperactive bowel sounds into thoracic cavity.   Musculoskeletal: Normal range of motion.  Lymphadenopathy:    He has no cervical adenopathy.  Neurological: He is alert and oriented to person, place, and time. He has normal reflexes. He displays normal reflexes. No cranial nerve deficit. He exhibits normal muscle tone. Coordination normal.  Cranial nerves 2-12 intact  Skin: Skin is warm and dry. He is not diaphoretic.  Psychiatric: He has a normal mood and affect. His behavior is normal.     ED Treatments / Results   Vitals:   12/02/15 2307 12/03/15 0156  BP: 109/92 117/78  Pulse: 80 71  Resp: 18 16  Temp: 97.7 F (36.5 C)    Results for orders placed or performed during the hospital encounter of 12/02/15  Urinalysis, Routine w reflex microscopic (not at [facility])  Result Value Ref Range   Color, Urine YELLOW YELLOW   APPearance CLEAR CLEAR   Specific Gravity, Urine 1.013 1.005 - 1.030   pH 6.0 5.0 - 8.0   Glucose, UA NEGATIVE NEGATIVE mg/dL   Hgb urine dipstick NEGATIVE NEGATIVE   Bilirubin Urine NEGATIVE NEGATIVE   Ketones, ur NEGATIVE NEGATIVE mg/dL   Protein, ur NEGATIVE NEGATIVE mg/dL   Nitrite NEGATIVE NEGATIVE   Leukocytes, UA NEGATIVE NEGATIVE   Dg Abdomen Acute W/chest  Result Date: 12/03/2015 CLINICAL DATA:  40 year old male with left-sided abdominal pain. Constipation. EXAM: DG ABDOMEN ACUTE W/ 1V CHEST COMPARISON:  Radiograph dated 02/16/2014 FINDINGS: The lungs are clear. There is no pleural effusion or pneumothorax. The cardiac silhouette is within normal limits. There is no bowel dilatation or evidence of obstruction. Moderate stool noted within the colon. A 4mm radiopaque focus overlying the right twelfth rib may represent a renal calculus, a fecalith, or a stone within the gallbladder. The osseous structures and the soft  tissues are unremarkable. IMPRESSION: Negative abdominal radiographs.  No acute cardiopulmonary disease. A 4 mm radiopaque focus in the right upper abdomen may represent a renal calculus, a gallstone, or a fecalith. Clinical correlation is recommended. Electronically Signed   By: Elgie Collard M.D.   On: 12/03/2015 01:37     Procedures Procedures (including critical care time)  Medications Ordered in ED Medications  ondansetron (ZOFRAN-ODT) disintegrating tablet 8 mg (8 mg Oral Given 12/02/15 2331)  dicyclomine (BENTYL) injection 20 mg (20 mg Intramuscular Given 12/02/15 2331)  gi cocktail (Maalox,Lidocaine,Donnatal) (30 mLs Oral Given 12/02/15 2331)     Final Clinical Impressions(s) / ED Diagnoses   Constipation and cramping secondary to diet.  Well appearing and pain free post medication. Sleeping soundly in the room.  PO challenged successfully in the ED>  I do not feel labs or advanced imaging are indicated at this time.  All questions answered to patient's parents satisfaction. Based on history and exam patient has been appropriately medically screened and emergency conditions excluded. Patient is stable for discharge at this time. Follow up with your PMD for recheck in 2  days and strict return precautions given. New Prescriptions New Prescriptions   No medications on file  Miralax and Bentyl     Jacklyn Branan, MD 12/03/15 [phone]

## 2015-12-02 NOTE — ED Notes (Signed)
MD at bedside. 

## 2015-12-02 NOTE — ED Notes (Signed)
Pt states he feels bloated, he feels like he has to vomit he his paranoid about vomiting, he feels like there is a lump on his left side.

## 2015-12-02 NOTE — ED Notes (Signed)
Patient transported to X-ray 

## 2015-12-03 MED ORDER — POLYETHYLENE GLYCOL 3350 17 GM/SCOOP PO POWD: 17.0000 g | Freq: Every day | ORAL | 0 refills | Status: DC

## 2015-12-03 MED ORDER — DICYCLOMINE HCL 20 MG PO TABS: 20.0000 mg | ORAL_TABLET | Freq: Two times a day (BID) | ORAL | 0 refills | Status: DC

## 2015-12-03 NOTE — ED Notes (Signed)
Pt alert, NAD, calm, interactive, no dyspnea noted, resting comfortably, (denies: pain, nausea, sob or dizziness), denies questions or needs, updated on wait and plan.

## 2015-12-03 NOTE — ED Notes (Signed)
[facility] updated with d/c plan.

## 2015-12-05 ENCOUNTER — Emergency Department (HOSPITAL_BASED_OUTPATIENT_CLINIC_OR_DEPARTMENT_OTHER)
Admission: EM | Admit: 2015-12-05 | Discharge: 2015-12-05 | Disposition: A | Discharge status: Home / Self Care | Payer: Self-pay | Attending: Emergency Medicine | Admitting: Emergency Medicine

## 2015-12-05 ENCOUNTER — Encounter (HOSPITAL_BASED_OUTPATIENT_CLINIC_OR_DEPARTMENT_OTHER): Payer: Self-pay

## 2015-12-05 DIAGNOSIS — F17210 Nicotine dependence, cigarettes, uncomplicated: Secondary | ICD-10-CM | POA: Insufficient documentation

## 2015-12-05 DIAGNOSIS — L600 Ingrowing nail: Secondary | ICD-10-CM | POA: Insufficient documentation

## 2015-12-05 MED ORDER — LIDOCAINE HCL 2 % IJ SOLN: 20.0000 mL | Freq: Once | INTRAMUSCULAR | Status: DC

## 2015-12-05 MED ORDER — LIDOCAINE HCL 2 % IJ SOLN
INTRAMUSCULAR | Status: DC
Start: 2015-12-05 — End: 2015-12-06
  Filled 2015-12-05: qty 20

## 2015-12-05 NOTE — ED Triage Notes (Signed)
C/o "ingrown toenail" right great toe x 1 week-NAD

## 2015-12-05 NOTE — Discharge Instructions (Signed)
Return here as needed. Keep the area clean and dry. You can soak in warm water and epsom salts.

## 2015-12-05 NOTE — ED Provider Notes (Signed)
[facility]Y [facility]PT [facility] Provider Note   CSN: [id number] Arrival date & time: 12/05/15  2126  By signing my name below, I, Dalton Fuentes, attest that this documentation has been prepared under the direction and in the presence of [employer], PA-C. Electronically Signed: Phillis Fuentes, ED Scribe. 12/05/15. 9:59 PM.  History   Chief Complaint Chief Complaint  Patient presents with  . Nail Problem   The history is provided by the patient. No language interpreter was used.   HPI Comments: Dalton Fuentes is a 40 y.o. male who presents to the Emergency Department complaining of a possible ingrown toenail to the right medial great toe onset 2 days ago. Pt reports associated pain and purulent drainage from the area. He reports a hx of the same, but has been able to resolve them in the past. He has used warm soaks in Epsom salt to no relief. Pt attempted to cut the nail out himself, which worsened his symptoms. He denies fever, chills, nausea, or vomiting.   History reviewed. No pertinent past medical history.  There are no active problems to display for this patient.   History reviewed. No pertinent surgical history.   Home Medications    Prior to Admission medications   Medication Sig Start Date End Date Taking? Authorizing Provider  dicyclomine (BENTYL) 20 MG tablet Take 1 tablet (20 mg total) by mouth 2 (two) times daily. 12/03/15   April Palumbo, MD  polyethylene glycol powder (MIRALAX) powder Take 17 g by mouth daily. 12/03/15   April Palumbo, MD    Family History No family history on file.  Social History Social History  Substance Use Topics  . Smoking status: Current Every Day Smoker    Packs/day: 0.50    Types: Cigarettes  . Smokeless tobacco: Never Used  . Alcohol use Yes     Comment: occ     Allergies   Review of patient's allergies indicates no known allergies.   Review of Systems Review of Systems  Constitutional: Negative for chills and fever.    Gastrointestinal: Negative for nausea and vomiting.  Skin: Positive for wound.   Physical Exam Updated Vital Signs BP 122/67 (BP Location: Left Arm)   Pulse 82   Temp 98.3 F (36.8 C) (Oral)   Resp 20   Ht 5\' 8"  (1.727 m)   Wt 180 lb (81.6 kg)   SpO2 98%   BMI 27.37 kg/m   Physical Exam  Constitutional: He is oriented to person, place, and time. He appears well-developed and well-nourished.  HENT:  Head: Normocephalic and atraumatic.  Cardiovascular: Normal rate and regular rhythm.   Pulmonary/Chest: Effort normal and breath sounds normal.  Musculoskeletal: Normal range of motion.       Feet:  Right great toe: purulent drainage noted to medial nail fold; erythema along medial aspect of toe and base of nail bed. No signs of abscess. NVI; tender to palpation  Neurological: He is alert and oriented to person, place, and time.  Skin: Skin is warm and dry. Capillary refill takes less than 2 seconds. No rash noted.  Psychiatric: He has a normal mood and affect. His behavior is normal.  Nursing note and vitals reviewed.  ED Treatments / Results  DIAGNOSTIC STUDIES: Oxygen Saturation is 100% on RA, normal by my interpretation.    COORDINATION OF CARE: 10:04 PM-Discussed treatment plan which includes wedge excision of nail with pt at bedside and pt agreed to plan.    Labs (all labs ordered are listed,  but only abnormal results are displayed) Labs Reviewed - No data to display  EKG  EKG Interpretation None       Radiology No results found.  Procedures .Nail Removal Date/Time: 12/05/2015 10:02 PM Performed by: Charlestine NightLAWYER, Jondavid Schreier Authorized by: Charlestine NightLAWYER, Muaad Boehning   Consent:    Consent obtained:  Verbal   Consent given by:  Patient Location:    Foot:  R big toe Pre-procedure details:    Preparation: Patient was prepped and draped in the usual sterile fashion   Anesthesia (see MAR for exact dosages):    Anesthesia method:  Nerve block   Block anesthetic:   Lidocaine 2% w/o epi Nail Removal:    Nail removed:  Partial   Nail side:  Medial Ingrown nail:    Wedge excision of skin of nail fold: Wedge excision of nail.   Post-procedure details:    Patient tolerance of procedure:  Tolerated well, no immediate complications   (including critical care time)  Medications Ordered in ED Medications - No data to display   Initial Impression / Assessment and Plan / ED Course  I have reviewed the triage vital signs and the nursing notes.  Pertinent labs & imaging results that were available during my care of the patient were reviewed by me and considered in my medical decision making (see chart for details).  Clinical Course     Final Clinical Impressions(s) / ED Diagnoses   I personally performed the services described in this documentation, which was scribed in my presence. The recorded information has been reviewed and is accurate.   Final diagnoses:  None    New Prescriptions New Prescriptions   No medications on file     Charlestine NightChristopher Jailen Coward, PA-C 12/05/15 2253    Loren Raceravid Yelverton, MD 12/07/15 [phone]

## 2016-07-03 ENCOUNTER — Encounter (HOSPITAL_BASED_OUTPATIENT_CLINIC_OR_DEPARTMENT_OTHER): Payer: Self-pay

## 2016-07-03 ENCOUNTER — Emergency Department (HOSPITAL_BASED_OUTPATIENT_CLINIC_OR_DEPARTMENT_OTHER)
Admission: EM | Admit: 2016-07-03 | Discharge: 2016-07-03 | Disposition: A | Discharge status: Home / Self Care | Payer: Self-pay | Attending: Emergency Medicine | Admitting: Emergency Medicine

## 2016-07-03 ENCOUNTER — Emergency Department (HOSPITAL_BASED_OUTPATIENT_CLINIC_OR_DEPARTMENT_OTHER): Payer: Self-pay

## 2016-07-03 DIAGNOSIS — R51 Headache: Secondary | ICD-10-CM | POA: Insufficient documentation

## 2016-07-03 DIAGNOSIS — R519 Headache, unspecified: Secondary | ICD-10-CM

## 2016-07-03 DIAGNOSIS — R112 Nausea with vomiting, unspecified: Secondary | ICD-10-CM | POA: Insufficient documentation

## 2016-07-03 DIAGNOSIS — F17210 Nicotine dependence, cigarettes, uncomplicated: Secondary | ICD-10-CM | POA: Insufficient documentation

## 2016-07-03 LAB — CBC WITH DIFFERENTIAL/PLATELET
Basophils Absolute: 0 10*3/uL (ref 0.0–0.1)
Basophils Relative: 0 %
EOS ABS: 0.1 10*3/uL (ref 0.0–0.7)
Eosinophils Relative: 2 %
HEMATOCRIT: 39.7 % (ref 39.0–52.0)
HEMOGLOBIN: 14.6 g/dL (ref 13.0–17.0)
LYMPHS ABS: 1.7 10*3/uL (ref 0.7–4.0)
Lymphocytes Relative: 19 %
MCH: 31.6 pg (ref 26.0–34.0)
MCHC: 36.8 g/dL — ABNORMAL HIGH (ref 30.0–36.0)
MCV: 85.9 fL (ref 78.0–100.0)
Monocytes Absolute: 0.7 10*3/uL (ref 0.1–1.0)
Monocytes Relative: 8 %
NEUTROS ABS: 6.4 10*3/uL (ref 1.7–7.7)
Neutrophils Relative %: 72 %
Platelets: 202 10*3/uL (ref 150–400)
RBC: 4.62 MIL/uL (ref 4.22–5.81)
RDW: 12.7 % (ref 11.5–15.5)
WBC: 8.9 10*3/uL (ref 4.0–10.5)

## 2016-07-03 LAB — BASIC METABOLIC PANEL
Anion gap: 9 (ref 5–15)
BUN: 8 mg/dL (ref 6–20)
CHLORIDE: 102 mmol/L (ref 101–111)
CO2: 25 mmol/L (ref 22–32)
Calcium: 8.8 mg/dL — ABNORMAL LOW (ref 8.9–10.3)
Creatinine, Ser: 1.1 mg/dL (ref 0.61–1.24)
GFR calc non Af Amer: >60 mL/min (ref >60)
Glucose, Bld: 106 mg/dL — ABNORMAL HIGH (ref 65–99)
POTASSIUM: 3.3 mmol/L — AB (ref 3.5–5.1)
Sodium: 136 mmol/L (ref 135–145)

## 2016-07-03 MED ORDER — PROMETHAZINE HCL 25 MG/ML IJ SOLN
25.0000 mg | Freq: Once | INTRAMUSCULAR | Status: AC
  Administered 2016-07-03: 25 mg via INTRAVENOUS
  Filled 2016-07-03: qty 1

## 2016-07-03 MED ORDER — DIPHENHYDRAMINE HCL 50 MG/ML IJ SOLN
25.0000 mg | Freq: Once | INTRAMUSCULAR | Status: AC
  Administered 2016-07-03: 25 mg via INTRAVENOUS
  Filled 2016-07-03: qty 1

## 2016-07-03 MED ORDER — KETOROLAC TROMETHAMINE 30 MG/ML IJ SOLN
30.0000 mg | Freq: Once | INTRAMUSCULAR | Status: AC
  Administered 2016-07-03: 30 mg via INTRAVENOUS
  Filled 2016-07-03: qty 1

## 2016-07-03 NOTE — ED Triage Notes (Signed)
C/o HA since 7pm-pt states he had to leave work due to pain/drove self to ED-presents to triage in w/c with towel over head

## 2016-07-03 NOTE — ED Provider Notes (Addendum)
[facility] Note   CSN: [id number] Arrival date & time: 07/03/16  2101  By signing my name below, I, Diona BrownerJennifer Gorman, attest that this documentation has been prepared under the direction and in the presence of Benjiman CorePickering, Cataleah Stites, MD. Electronically Signed: Diona BrownerJennifer Gorman, ED Scribe. 07/03/16. 9:18 PM.   History   Chief Complaint Chief Complaint  Patient presents with  . Headache    HPI Dalton Fuentes is a 41 y.o. male who presents to the Emergency Department complaining of a gradually worsening HA that started ~ 7 pm. Pt reports pain was gradual at first and then it suddenly worsened to severe pain. Associated sx include nausea, vomiting and light sensitivity. He notes he has broken bones before and this is the worst pain he has ever felt. Discomfort is above and behind his eyes, more prominent on the left side. Pt denies numbness and visual disturbance.  The history is provided by the patient. No language interpreter was used.    History reviewed. No pertinent past medical history.  There are no active problems to display for this patient.   History reviewed. No pertinent surgical history.     Home Medications    Prior to Admission medications   Not on File    Family History No family history on file.  Social History Social History  Substance Use Topics  . Smoking status: Current Every Day Smoker    Packs/day: 0.50    Types: Cigarettes  . Smokeless tobacco: Never Used  . Alcohol use No     Allergies   Patient has no known allergies.   Review of Systems Review of Systems  Eyes: Positive for photophobia. Negative for visual disturbance.  Gastrointestinal: Positive for nausea and vomiting.  Neurological: Positive for headaches. Negative for numbness.     Physical Exam Updated Vital Signs BP 118/88 (BP Location: Left Arm)   Pulse 63   Temp 97.9 F (36.6 C) (Oral)   Resp 18   Ht 5\' 8"  (1.727 m)   Wt 79.4 kg (175 lb)   SpO2  100%   BMI 26.61 kg/m   Physical Exam  Constitutional: He is oriented to person, place, and time. He appears well-developed and well-nourished.  Uncomfortable appearing. Is lying down with a towel over his head.  HENT:  Head: Normocephalic and atraumatic.  Mouth/Throat: Oropharynx is clear and moist.  Eyes: Conjunctivae and EOM are normal. Pupils are equal, round, and reactive to light.  Photophobic. Pupils are bilateral.  Neck: Normal range of motion.  No meningismus. Face is symmetric.   Cardiovascular: Normal rate, regular rhythm, normal heart sounds and intact distal pulses.   Pulmonary/Chest: Effort normal.  Abdominal: Bowel sounds are normal. He exhibits no distension. There is no tenderness.  Musculoskeletal: Normal range of motion.  Neurological: He is alert and oriented to person, place, and time.  Psychiatric: He has a normal mood and affect.  Nursing note and vitals reviewed.    ED Treatments / Results  COORDINATION OF CARE: 9:18 PM-Discussed next steps with pt. Pt verbalized understanding and is agreeable with the plan.  Results for orders placed or performed during the hospital encounter of 07/03/16  CBC with Differential  Result Value Ref Range   WBC 8.9 4.0 - 10.5 K/uL   RBC 4.62 4.22 - 5.81 MIL/uL   Hemoglobin 14.6 13.0 - 17.0 g/dL   HCT 40.939.7 81.139.0 - 91.452.0 %   MCV 85.9 78.0 - 100.0 fL   MCH 31.6 26.0 -  34.0 pg   MCHC 36.8 (H) 30.0 - 36.0 g/dL   RDW 09.8 11.9 - 14.7 %   Platelets 202 150 - 400 K/uL   Neutrophils Relative % 72 %   Neutro Abs 6.4 1.7 - 7.7 K/uL   Lymphocytes Relative 19 %   Lymphs Abs 1.7 0.7 - 4.0 K/uL   Monocytes Relative 8 %   Monocytes Absolute 0.7 0.1 - 1.0 K/uL   Eosinophils Relative 2 %   Eosinophils Absolute 0.1 0.0 - 0.7 K/uL   Basophils Relative 0 %   Basophils Absolute 0.0 0.0 - 0.1 K/uL  Basic metabolic panel  Result Value Ref Range   Sodium 136 135 - 145 mmol/L   Potassium 3.3 (L) 3.5 - 5.1 mmol/L   Chloride 102 101 -  111 mmol/L   CO2 25 22 - 32 mmol/L   Glucose, Bld 106 (H) 65 - 99 mg/dL   BUN 8 6 - 20 mg/dL   Creatinine, Ser 8.29 0.61 - 1.24 mg/dL   Calcium 8.8 (L) 8.9 - 10.3 mg/dL   GFR calc non Af Amer >60 >60 mL/min   GFR calc Af Amer >60 >60 mL/min   Anion gap 9 5 - 15   Ct Head Wo Contrast  Result Date: 07/03/2016 CLINICAL DATA:  Headache EXAM: CT HEAD WITHOUT CONTRAST TECHNIQUE: Contiguous axial images were obtained from the base of the skull through the vertex without intravenous contrast. COMPARISON:  None. FINDINGS: Brain: No evidence of acute infarction, hemorrhage, hydrocephalus, extra-axial collection or mass lesion/mass effect. Vascular: No hyperdense vessel or unexpected calcification. Skull: Normal. Negative for fracture or focal lesion. Sinuses/Orbits: No acute finding. Other: None. IMPRESSION: Negative CT examination of the brain without contrast Electronically Signed   By: Jasmine Pang M.D.   On: 07/03/2016 21:53    EKG  EKG Interpretation None       Radiology Ct Head Wo Contrast  Result Date: 07/03/2016 CLINICAL DATA:  Headache EXAM: CT HEAD WITHOUT CONTRAST TECHNIQUE: Contiguous axial images were obtained from the base of the skull through the vertex without intravenous contrast. COMPARISON:  None. FINDINGS: Brain: No evidence of acute infarction, hemorrhage, hydrocephalus, extra-axial collection or mass lesion/mass effect. Vascular: No hyperdense vessel or unexpected calcification. Skull: Normal. Negative for fracture or focal lesion. Sinuses/Orbits: No acute finding. Other: None. IMPRESSION: Negative CT examination of the brain without contrast Electronically Signed   By: Jasmine Pang M.D.   On: 07/03/2016 21:53    Procedures Procedures (including critical care time)  Medications Ordered in ED Medications  promethazine (PHENERGAN) injection 25 mg (25 mg Intravenous Given 07/03/16 2128)  ketorolac (TORADOL) 30 MG/ML injection 30 mg (30 mg Intravenous Given 07/03/16 2205)    diphenhydrAMINE (BENADRYL) injection 25 mg (25 mg Intravenous Given 07/03/16 2226)     Initial Impression / Assessment and Plan / ED Course  I have reviewed the triage vital signs and the nursing notes.  Pertinent labs & imaging results that were available during my care of the patient were reviewed by me and considered in my medical decision making (see chart for details).     *Patient with headache. Initially severe but feels much better after treatment. Head CT reassuring. Was done within 6 hours of the pain onset. Labs reassuring. Will have follow-up with neurology. Did have some mild akathisia related to the Phenergan.  Final Clinical Impressions(s) / ED Diagnoses   Final diagnoses:  Acute nonintractable headache, unspecified headache type    New Prescriptions New Prescriptions  No medications on file   I personally performed the services described in this documentation, which was scribed in my presence. The recorded information has been reviewed and is accurate.       Benjiman Core, MD 07/03/16 Verdis Prime    Benjiman Core, MD 07/03/16 [phone]

## 2016-07-16 ENCOUNTER — Emergency Department (HOSPITAL_BASED_OUTPATIENT_CLINIC_OR_DEPARTMENT_OTHER)
Admission: EM | Admit: 2016-07-16 | Discharge: 2016-07-16 | Disposition: A | Discharge status: Home / Self Care | Payer: Self-pay | Attending: Emergency Medicine | Admitting: Emergency Medicine

## 2016-07-16 ENCOUNTER — Encounter (HOSPITAL_BASED_OUTPATIENT_CLINIC_OR_DEPARTMENT_OTHER): Payer: Self-pay

## 2016-07-16 ENCOUNTER — Other Ambulatory Visit: Payer: Self-pay

## 2016-07-16 DIAGNOSIS — R42 Dizziness and giddiness: Secondary | ICD-10-CM | POA: Insufficient documentation

## 2016-07-16 DIAGNOSIS — F17210 Nicotine dependence, cigarettes, uncomplicated: Secondary | ICD-10-CM | POA: Insufficient documentation

## 2016-07-16 LAB — CBC WITH DIFFERENTIAL/PLATELET
BASOS PCT: 0 %
Basophils Absolute: 0 10*3/uL (ref 0.0–0.1)
Eosinophils Absolute: 0.1 10*3/uL (ref 0.0–0.7)
Eosinophils Relative: 2 %
HCT: 40.8 % (ref 39.0–52.0)
HEMOGLOBIN: 14.4 g/dL (ref 13.0–17.0)
Lymphocytes Relative: 24 %
Lymphs Abs: 1.6 10*3/uL (ref 0.7–4.0)
MCH: 30.9 pg (ref 26.0–34.0)
MCHC: 35.3 g/dL (ref 30.0–36.0)
MCV: 87.6 fL (ref 78.0–100.0)
MONO ABS: 0.5 10*3/uL (ref 0.1–1.0)
MONOS PCT: 7 %
Neutro Abs: 4.4 10*3/uL (ref 1.7–7.7)
Neutrophils Relative %: 67 %
Platelets: 221 10*3/uL (ref 150–400)
RBC: 4.66 MIL/uL (ref 4.22–5.81)
RDW: 12.7 % (ref 11.5–15.5)
WBC: 6.6 10*3/uL (ref 4.0–10.5)

## 2016-07-16 LAB — BASIC METABOLIC PANEL
ANION GAP: 6 (ref 5–15)
BUN: 11 mg/dL (ref 6–20)
CALCIUM: 8.8 mg/dL — AB (ref 8.9–10.3)
CHLORIDE: 105 mmol/L (ref 101–111)
CO2: 24 mmol/L (ref 22–32)
Creatinine, Ser: 1.03 mg/dL (ref 0.61–1.24)
GFR calc Af Amer: >60 mL/min (ref >60)
GFR calc non Af Amer: >60 mL/min (ref >60)
GLUCOSE: 125 mg/dL — AB (ref 65–99)
Potassium: 3.6 mmol/L (ref 3.5–5.1)
Sodium: 135 mmol/L (ref 135–145)

## 2016-07-16 MED ORDER — SODIUM CHLORIDE 0.9 % IV BOLUS (SEPSIS)
1000.0000 mL | Freq: Once | INTRAVENOUS | Status: AC
  Administered 2016-07-16: 1000 mL via INTRAVENOUS

## 2016-07-16 NOTE — ED Notes (Signed)
ED Provider at bedside. 

## 2016-07-16 NOTE — ED Notes (Signed)
ED Provider at bedside explaining discharge instructions.   Pt verbalized understanding of discharge instructions and denies any further questions at this time.

## 2016-07-16 NOTE — ED Provider Notes (Signed)
[facility]-EMERGENCY DEPT Provider Note   CSN: [id number] Arrival date & time: 07/16/16  1139     History   Chief Complaint Chief Complaint  Patient presents with  . Dizziness    HPI Dalton Fuentes is a 41 y.o. male who presents with 2 days of diarrhea and dizziness/lightheadedness that began yesterday. Patient states that he has had 2-3 episodes of diarrhea daily since onset of diarrhea symptoms. He denies any blood or black tarry stools. He states that lightheadedness began yesterday while he was at work. He denies any preceding trauma, fall or injury. Patient states that he feels generally weak and "just off." He states that symptoms are worsened when he attempts to move and is worsened by getting up from a seated position. He denies any room spinning sensation or feelings of off balance when attempting to ambulate. He states that since onset of diarrhea he has had decreased PO. He denies any recent illness or fever. He denies any recent trauma or injury. He has not been taking anything for the medications. He was seen at the ED on 07/03/16 for a headache. At that time he had a normal CT and was discharged with follow-up instructions to see neurology. Patient states that his headaches have improved since then. Patient denies any fever, chest pain, vision changes, shortness of breath, abdominal pain, nausea/vomiting, numbness/weakness of his arms or legs, speech difficulty.  The history is provided by the patient.    History reviewed. No pertinent past medical history.  There are no active problems to display for this patient.   History reviewed. No pertinent surgical history.     Home Medications    Prior to Admission medications   Not on File    Family History No family history on file.  Social History Social History  Substance Use Topics  . Smoking status: Current Every Day Smoker    Packs/day: 0.50    Types: Cigarettes  . Smokeless tobacco: Never Used  . Alcohol use No       Allergies   Patient has no known allergies.   Review of Systems Review of Systems  Constitutional: Negative for chills and fever.  HENT: Negative for congestion.   Eyes: Negative for visual disturbance.  Respiratory: Negative for cough and shortness of breath.   Cardiovascular: Negative for chest pain.  Gastrointestinal: Positive for diarrhea. Negative for abdominal pain, blood in stool, nausea and vomiting.  Genitourinary: Negative for dysuria and hematuria.  Musculoskeletal: Negative for back pain.  Skin: Negative for rash.  Neurological: Positive for dizziness and light-headedness. Negative for speech difficulty, weakness, numbness and headaches.  All other systems reviewed and are negative.    Physical Exam Updated Vital Signs BP 110/84   Pulse 67   Temp 98.2 F (36.8 C) (Oral)   Resp (!) 23   Ht 5\' 8"  (1.727 m)   Wt 80.3 kg (177 lb)   SpO2 98%   BMI 26.91 kg/m   Physical Exam  Constitutional: He is oriented to person, place, and time. He appears well-developed and well-nourished.  Sitting comfortably on examination table  HENT:  Head: Normocephalic and atraumatic.  Mouth/Throat: Uvula is midline and oropharynx is clear and moist. Mucous membranes are dry.  Eyes: Conjunctivae, EOM and lids are normal. Pupils are equal, round, and reactive to light.  Neck: Full passive range of motion without pain. No neck rigidity.  Full flexion/extension and lateral movement of neck fully intact. No bony midline tenderness. No deformities or crepitus.  Cardiovascular: Normal rate, regular rhythm, normal heart sounds and normal pulses.  Exam reveals no gallop and no friction rub.   No murmur heard. Pulses:      Radial pulses are 2+ on the right side, and 2+ on the left side.  Pulmonary/Chest: Effort normal and breath sounds normal.  Abdominal: Soft. Normal appearance. There is no tenderness. There is no rigidity and no guarding.  No peritoneal signs  Musculoskeletal:  Normal range of motion.  Neurological: He is alert and oriented to person, place, and time. GCS eye subscore is 4. GCS verbal subscore is 5. GCS motor subscore is 6.  Cranial nerves III-XII intact Follows commands, Moves all extremities  5/5 strength to BUE and BLE  Sensation intact throughout  Normal finger to nose. No dysdiadochokinesia. No pronator drift. No gait abnormalities  No slurred speech. No facial droop.   Skin: Skin is warm and dry. Capillary refill takes less than 2 seconds.  Psychiatric: He has a normal mood and affect. His speech is normal.  Nursing note and vitals reviewed.    ED Treatments / Results  Labs (all labs ordered are listed, but only abnormal results are displayed) Labs Reviewed  BASIC METABOLIC PANEL - Abnormal; Notable for the following:       Result Value   Glucose, Bld 125 (*)    Calcium 8.8 (*)    All other components within normal limits  CBC WITH DIFFERENTIAL/PLATELET    EKG  EKG Interpretation None       Radiology No results found.  Procedures Procedures (including critical care time)  Medications Ordered in ED Medications  sodium chloride 0.9 % bolus 1,000 mL (0 mLs Intravenous Stopped 07/16/16 1440)     Initial Impression / Assessment and Plan / ED Course  I have reviewed the triage vital signs and the nursing notes.  Pertinent labs & imaging results that were available during my care of the patient were reviewed by me and considered in my medical decision making (see chart for details).     41 year old male who presents to emergency department with complaints of dizziness/lightheadedness and diarrhea. Diarrhea began 2 days ago and dizziness/lightheadedness began this morning. Per patient, he has been having decreased by mouth since onset of diarrhea. Patient is afebrile, non-toxic appearing, sitting comfortably on examination table. Vital signs reviewed. On initial evaluation, patient is slightly hypotensive at 115/71. No  neuro deficits on exam. No concerning red flag warning symptoms and history. Given recent history of sickness and decreased by mouth combined with history/physical exam, concern for dehydration. Also consider orthostatic hypotension. No indication for imaging at this time. History/physical exam are not concerning for vertigo or acute ICH. Basic labs ordered at triage including CBC, BMP. Will plan to give IVF in the department for fluid resuscitation.  Labs and imaging reviewed. BMP within normal limits. No I abnormality. CBC within normal limits. Elevation of white blood cell count. Discussed results with patient. Throughout interview blood pressure still 110/80. Concern that this may be orthostatic hypotension. Will by mouth challenge patient in department and obtain orthostatic vital signs.  Re-evaluation: Patient reports improvement in symptoms. Patient ambulated in the hall accompanied by myself. Patient had no gait abnormalities and was asymptomatic during our walk throughout the emergency pertinent.   Negative orthostatic vital signs. Patient reports improvement in symptoms. He is able to ambulate without difficulty or symptoms. Patient stable for discharge at this time. Provided patient with a list of clinic resources to use if he does  not have a PCP. Instructed to call them today to arrange follow-up in the next 24-48 hours. Return precautions discussed. Patient expresses understanding and agreement to plan.    Final Clinical Impressions(s) / ED Diagnoses   Final diagnoses:  Lightheadedness  Dizziness    New Prescriptions There are no discharge medications for this patient.    Maxwell CaulLayden, Lindsey A, PA-C 07/17/16 1258    Doug SouJacubowitz, Sam, MD 07/17/16 [id number]

## 2016-07-16 NOTE — ED Provider Notes (Signed)
ED ECG REPORT   Date: 07/16/2016  Rate: 70  Rhythm: normal sinus rhythm  QRS Axis: normal  Intervals: normal  ST/T Wave abnormalities: early repolarization  Conduction Disutrbances:none  Narrative Interpretation:   Old EKG Reviewed: none available  I have personally reviewed the EKG tracing and agree with the computerized printout as noted.   Doug SouJacubowitz, Marvene Strohm, MD 07/16/16 [phone]

## 2016-07-16 NOTE — ED Notes (Signed)
Pt was able to ambulate with no assistance. Pt stated that he was still a little dizzy but it is better that it was.

## 2016-07-16 NOTE — ED Notes (Signed)
Pt on monitor 

## 2016-07-16 NOTE — Discharge Instructions (Signed)
Follow-up with the primary care listed below for further evaluation.  Make sure you're drinking fluids and eating properly.  Return the emergency Department for any worsening symptoms, difficulty walking, numbness/weakness of her arms or legs, chest pain difficult to breathing or any other worsening or concerning symptoms.   If you do not have a primary care doctor you see regularly, please you the list below. Please call them to arrange for follow-up.    No Primary Care Doctor Call [facility]  [phone] Other agencies that provide inexpensive medical care    [facility]  [phone]    [facility]  [phone]    [facility]  [phone]    [facility]  [phone]    [facility]  [phone]    [facility]  [phone]

## 2016-07-16 NOTE — ED Triage Notes (Signed)
C/o dizziness started 6pm yesterday-diarrhea x 2 days-NAD-steady gait

## 2018-03-08 IMAGING — CT CT HEAD W/O CM
3 series · 15 of 47 positions shown, 18 images · non-contrast
Comparison: None.

CLINICAL DATA: Headache

EXAM:
CT HEAD WITHOUT CONTRAST
TECHNIQUE: Contiguous axial images were obtained from the base of the skull
through the vertex without intravenous contrast.

[Series 2: head wo · axial · 0.43mm/px · z∈[-165,-40]mm · 9 of 31 slices shown, 12 images]
[im 3/31  brain]
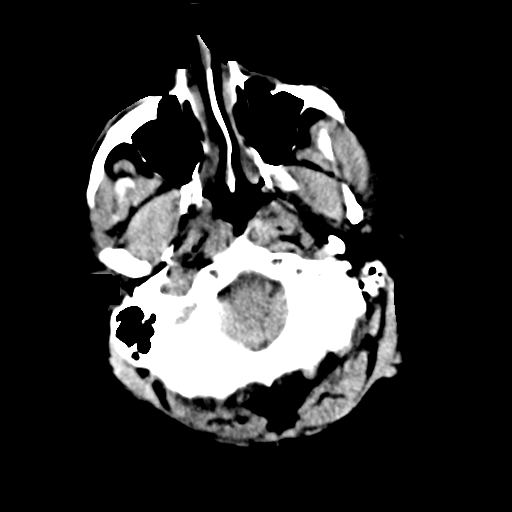
[im 3/31  bone]
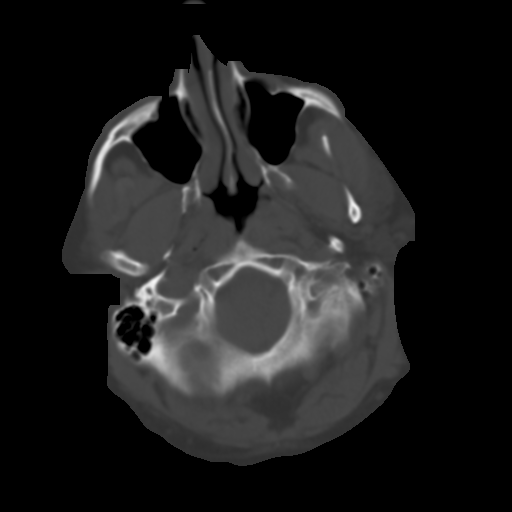
[im 6/31  brain]
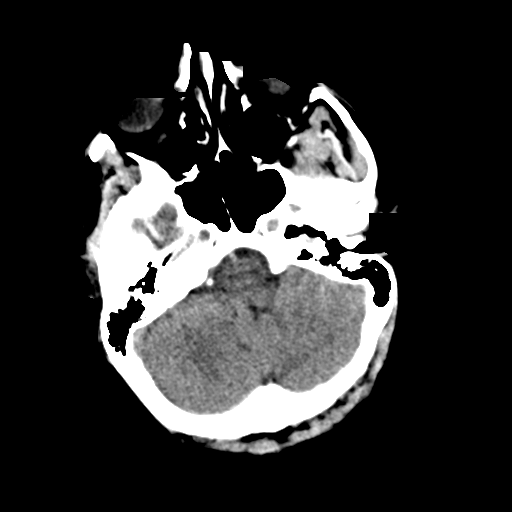
[im 9/31  brain]
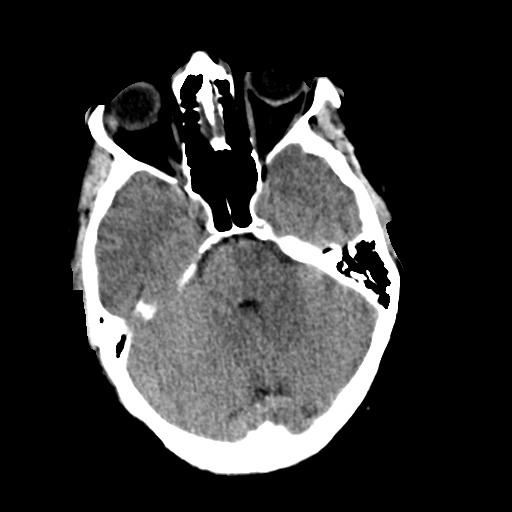
[im 12/31  brain]
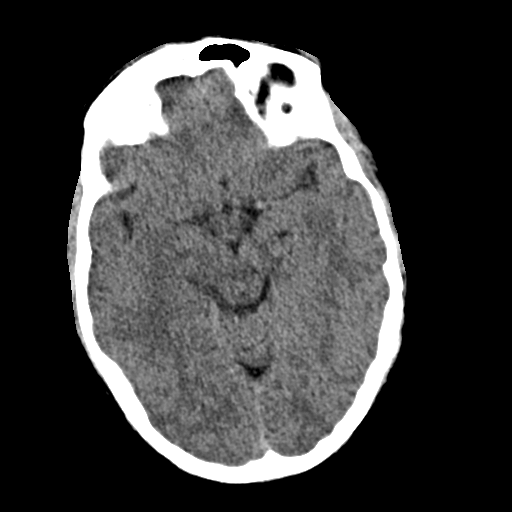
[im 16/31  brain]
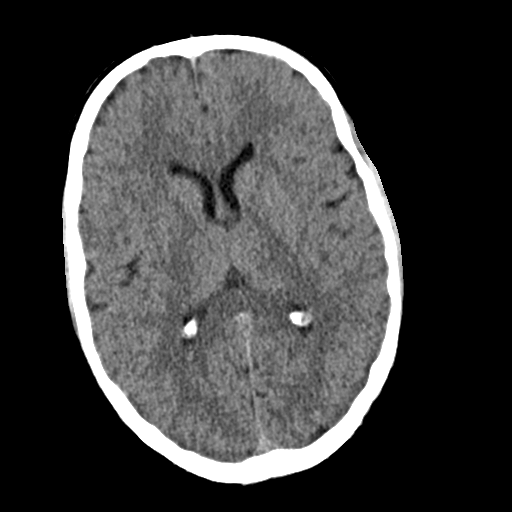
[im 16/31  bone]
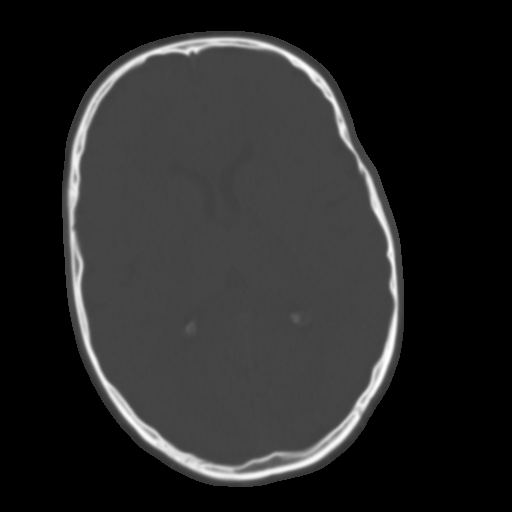
[im 19/31  brain]
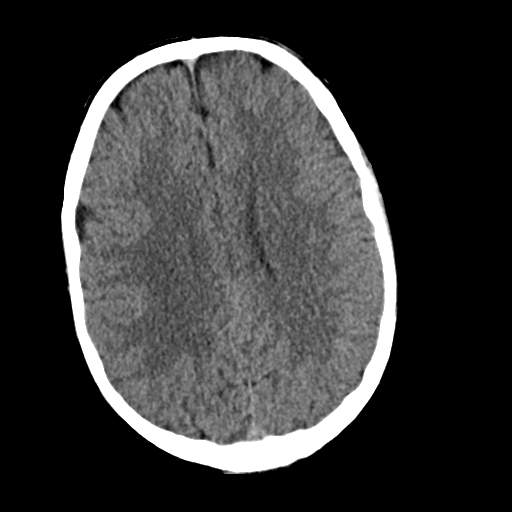
[im 22/31  brain]
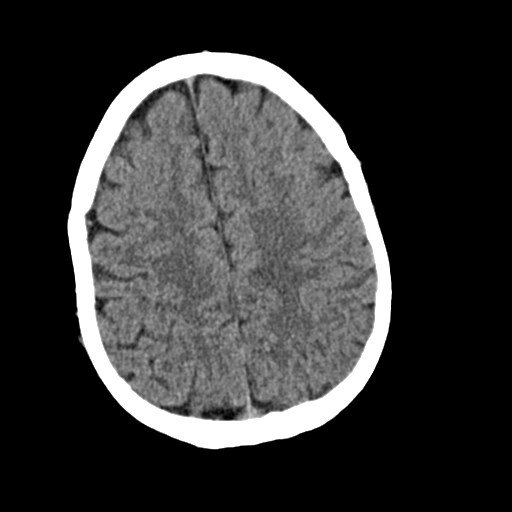
[im 25/31  brain]
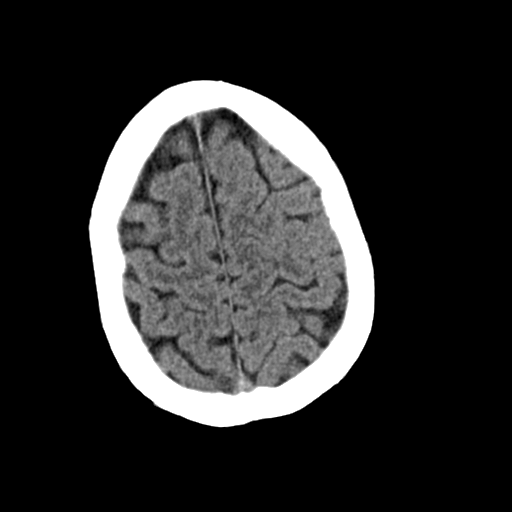
[im 28/31  brain]
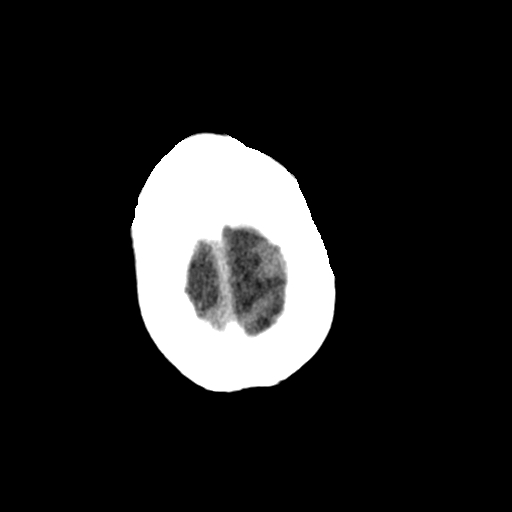
[im 28/31  bone]
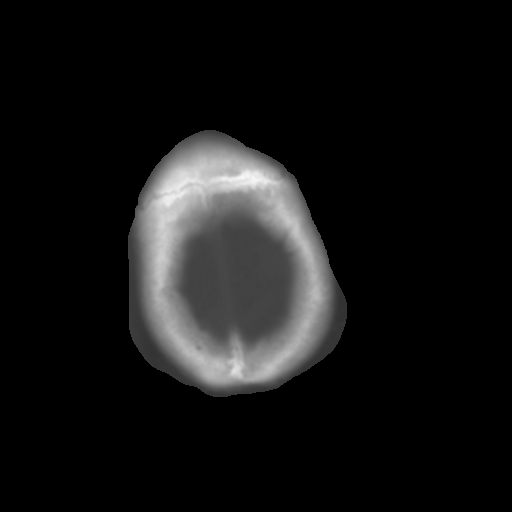

[Series 4: coronal soft · coronal · 0.33mm/px · 3 of 70 slices shown]
[im 24/70  brain]
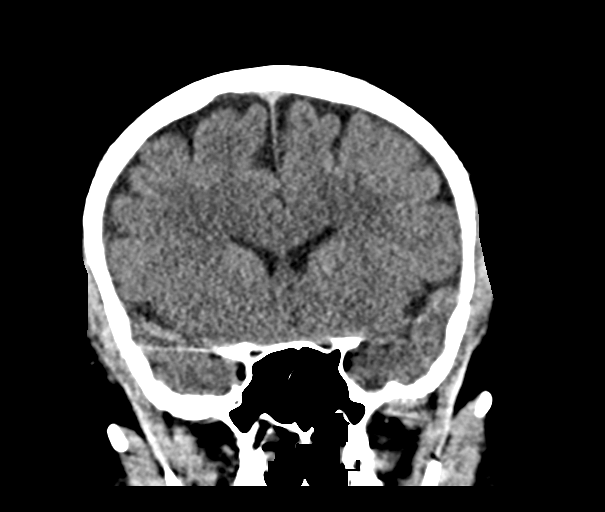
[im 31/70  brain]
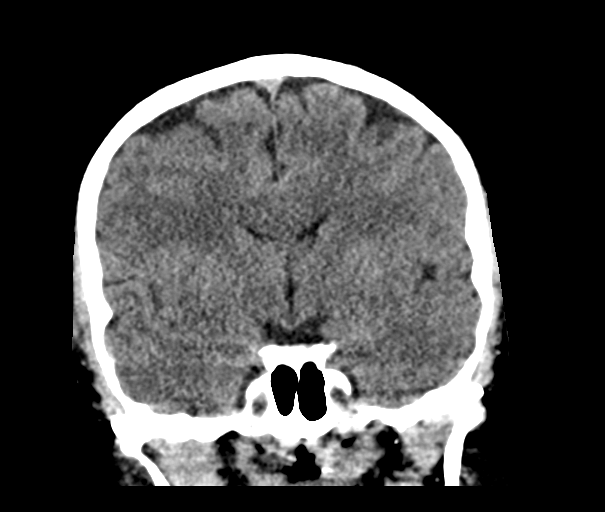
[im 39/70  brain]
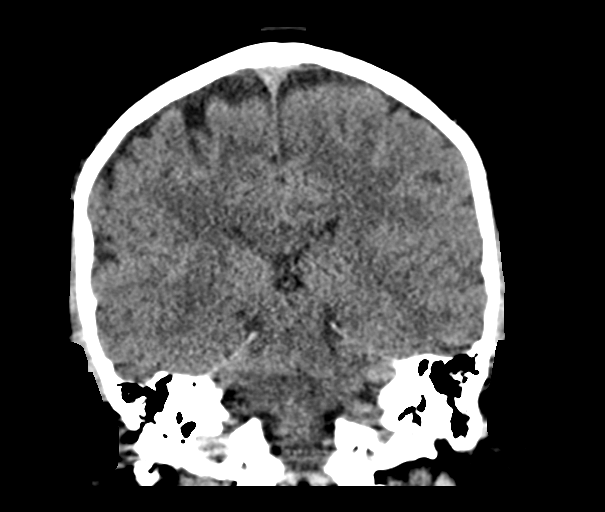

[Series 5: sag soft · sagittal · 0.33mm/px · 3 of 56 slices shown]
[im 19/56  brain]
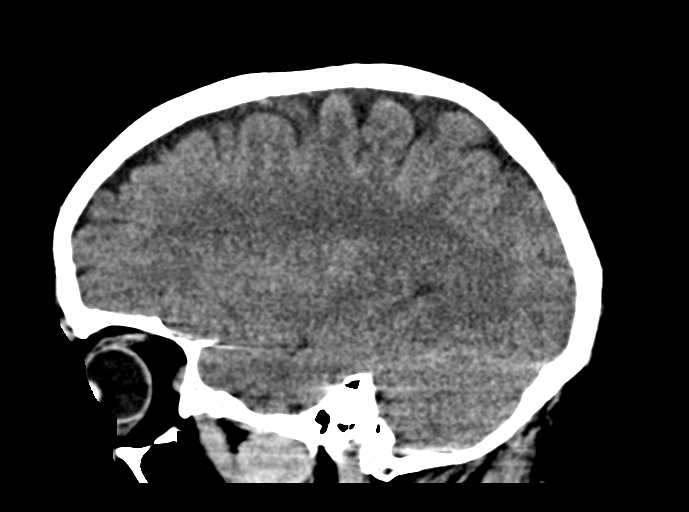
[im 28/56  brain]
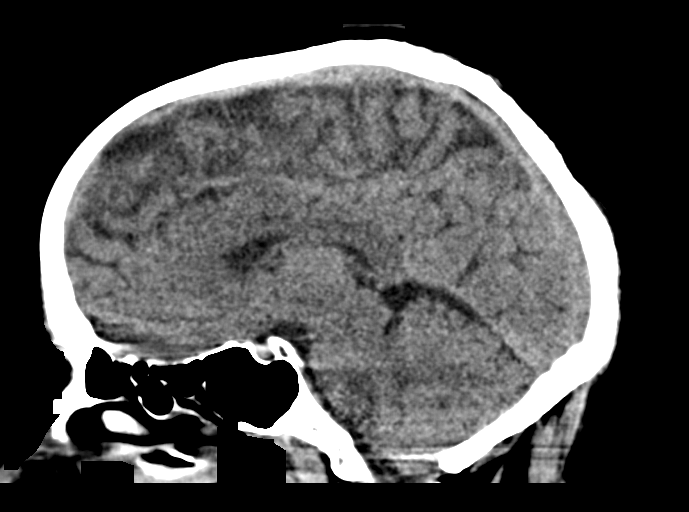
[im 37/56  brain]
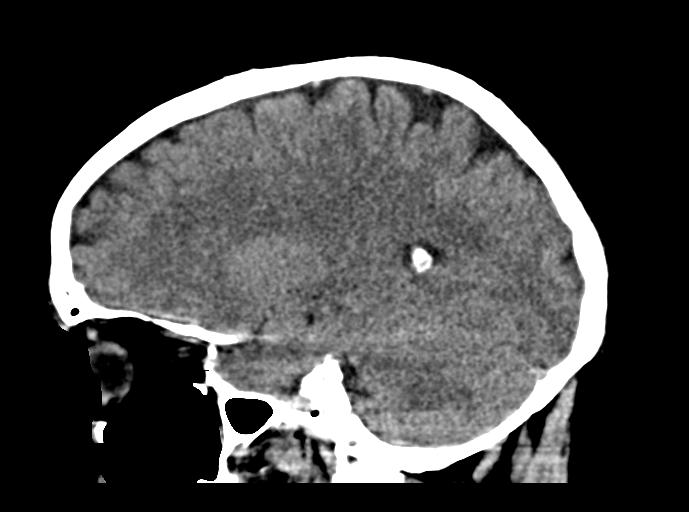

[15 of 47 positions shown; findings below may reference images not displayed]

FINDINGS: Brain: No evidence of acute infarction, hemorrhage, hydrocephalus,
extra-axial collection or mass lesion/mass effect.

Vascular: No hyperdense vessel or unexpected calcification.

Skull: Normal. Negative for fracture or focal lesion.

Sinuses/Orbits: No acute finding.

Other: None.
IMPRESSION: Negative CT examination of the brain without contrast

## 2018-05-10 ENCOUNTER — Other Ambulatory Visit: Payer: Self-pay

## 2018-05-10 ENCOUNTER — Emergency Department (HOSPITAL_BASED_OUTPATIENT_CLINIC_OR_DEPARTMENT_OTHER)
Admission: EM | Admit: 2018-05-10 | Discharge: 2018-05-10 | Disposition: A | Discharge status: Home / Self Care | Payer: BLUE CROSS/BLUE SHIELD | Attending: Emergency Medicine | Admitting: Emergency Medicine

## 2018-05-10 ENCOUNTER — Encounter (HOSPITAL_BASED_OUTPATIENT_CLINIC_OR_DEPARTMENT_OTHER): Payer: Self-pay | Admitting: Emergency Medicine

## 2018-05-10 DIAGNOSIS — Z79899 Other long term (current) drug therapy: Secondary | ICD-10-CM | POA: Insufficient documentation

## 2018-05-10 DIAGNOSIS — F17210 Nicotine dependence, cigarettes, uncomplicated: Secondary | ICD-10-CM | POA: Insufficient documentation

## 2018-05-10 DIAGNOSIS — F1210 Cannabis abuse, uncomplicated: Secondary | ICD-10-CM | POA: Diagnosis not present

## 2018-05-10 DIAGNOSIS — N23 Unspecified renal colic: Secondary | ICD-10-CM | POA: Diagnosis not present

## 2018-05-10 DIAGNOSIS — R109 Unspecified abdominal pain: Secondary | ICD-10-CM | POA: Diagnosis present

## 2018-05-10 DIAGNOSIS — R112 Nausea with vomiting, unspecified: Secondary | ICD-10-CM | POA: Insufficient documentation

## 2018-05-10 LAB — URINALYSIS, ROUTINE W REFLEX MICROSCOPIC
BILIRUBIN URINE: NEGATIVE
Glucose, UA: NEGATIVE mg/dL
Hgb urine dipstick: NEGATIVE
Ketones, ur: NEGATIVE mg/dL
LEUKOCYTE UA: NEGATIVE
NITRITE: NEGATIVE
PH: 5.5 (ref 5.0–8.0)
Protein, ur: NEGATIVE mg/dL

## 2018-05-10 MED ORDER — FENTANYL CITRATE (PF) 100 MCG/2ML IJ SOLN
50.0000 ug | Freq: Once | INTRAMUSCULAR | Status: AC
  Administered 2018-05-10: 05:00:00 via INTRAVENOUS
  Filled 2018-05-10: qty 2

## 2018-05-10 MED ORDER — OXYCODONE-ACETAMINOPHEN 5-325 MG PO TABS
2.0000 | ORAL_TABLET | Freq: Once | ORAL | Status: AC
  Administered 2018-05-10: 2 via ORAL
  Filled 2018-05-10: qty 2

## 2018-05-10 MED ORDER — KETOROLAC TROMETHAMINE 15 MG/ML IJ SOLN
15.0000 mg | Freq: Once | INTRAMUSCULAR | Status: AC
  Administered 2018-05-10: 15 mg via INTRAVENOUS
  Filled 2018-05-10: qty 1

## 2018-05-10 MED ORDER — FENTANYL CITRATE (PF) 100 MCG/2ML IJ SOLN
100.0000 ug | Freq: Once | INTRAMUSCULAR | Status: AC
  Administered 2018-05-10: 100 ug via INTRAVENOUS
  Filled 2018-05-10: qty 2

## 2018-05-10 MED ORDER — FENTANYL CITRATE (PF) 100 MCG/2ML IJ SOLN
50.0000 ug | Freq: Once | INTRAMUSCULAR | Status: AC
Start: 2018-05-10 — End: 2018-05-10
  Administered 2018-05-10: 50 ug via INTRAVENOUS
  Filled 2018-05-10: qty 2

## 2018-05-10 MED ORDER — TAMSULOSIN HCL 0.4 MG PO CAPS: ORAL_CAPSULE | ORAL | 0 refills | Status: DC

## 2018-05-10 MED ORDER — TAMSULOSIN HCL 0.4 MG PO CAPS
0.4000 mg | ORAL_CAPSULE | Freq: Once | ORAL | Status: AC
  Administered 2018-05-10: 0.4 mg via ORAL

## 2018-05-10 MED ORDER — HYDROMORPHONE HCL 4 MG PO TABS: 2.0000 mg | ORAL_TABLET | ORAL | 0 refills | Status: DC | PRN

## 2018-05-10 MED ORDER — ONDANSETRON 8 MG PO TBDP: 8.0000 mg | ORAL_TABLET | Freq: Three times a day (TID) | ORAL | 0 refills | Status: DC | PRN

## 2018-05-10 MED ORDER — ONDANSETRON HCL 4 MG/2ML IJ SOLN: 4.0000 mg | Freq: Once | INTRAMUSCULAR | Status: DC | PRN

## 2018-05-10 NOTE — ED Notes (Signed)
Prompted to provide urine sample. Pt very drowsy after fentanyl admin with EMS.

## 2018-05-10 NOTE — ED Notes (Addendum)
Entered room to prompt again to provide urine sample, pt asleep upon RN entering room. Pt then began to complain of 10/10 pain, unable to sit still in bed shortly after. MD notified of continued pain. Offered to cath patient for sample - Dr. Read Drivers does NOT want patient cathed for sample. See new orders.

## 2018-05-10 NOTE — ED Notes (Addendum)
Pt reports multiple attempts to void without success. Bladder scanned at this time in case of retention - volume in bladder . Reassured patient. Fluids given in hopes of obtaining sample. Falling asleep during IV fentanyl administration and reports continued pain.

## 2018-05-10 NOTE — ED Provider Notes (Signed)
[facility] Provider Note: Lowella Dell, MD, FACEP  CSN: [id number] MRN: [id number] ARRIVAL: 05/10/18 at 0326 ROOM: [facility]06/[facility]   CHIEF COMPLAINT  Flank Pain   HISTORY OF PRESENT ILLNESS  05/10/18 3:34 AM CHAE ROBLEY is a 43 y.o. male who had a known 5 mm right renal stone found on CT in January of this year.  He is here with right flank pain that began about an hour and a half prior to arrival.  He described the pain as severe (10 out of 10) and sharp.  It was associated with nausea and vomiting.  He was given 200 mcg of fentanyl IV and 4 mg of Zofran IV by EMS prior to arrival with improvement of his pain to a 5 out of 10.     History reviewed. No pertinent past medical history.  History reviewed. No pertinent surgical history.  History reviewed. No pertinent family history.  Social History   Tobacco Use  . Smoking status: Current Every Day Smoker    Packs/day: 0.50    Types: Cigarettes  . Smokeless tobacco: Never Used  Substance Use Topics  . Alcohol use: No  . Drug use: Yes    Types: Marijuana    Prior to Admission medications   Medication Sig Start Date End Date Taking? Authorizing Provider  omeprazole (PRILOSEC) 40 MG capsule  04/19/18  Yes [provider]    Allergies Patient has no known allergies.   REVIEW OF SYSTEMS  Negative except as noted here or in the History of Present Illness.   PHYSICAL EXAMINATION  Initial Vital Signs Blood pressure 101/78, pulse 60, temperature (!) 97.4 F (36.3 C), temperature source Oral, resp. rate 14, height 5\' 8"  (1.727 m), weight 65.8 kg, SpO2 93 %.  Examination General: Well-developed, well-nourished male in no acute distress; appearance consistent with age of record HENT: normocephalic; atraumatic Eyes: Normal appearance Neck: supple Heart: regular rate and rhythm Lungs: clear to auscultation bilaterally Abdomen: soft; nondistended; nontender; bowel sounds present GU: No CVA tenderness  Extremities: No deformity; full range of motion; pulses normal Neurologic: Awake but drowsy; motor function intact in all extremities and symmetric; no facial droop Skin: Diaphoretic Psychiatric: Flat affect   RESULTS  Summary of this visit's results, reviewed by myself:   EKG Interpretation  Date/Time:    Ventricular Rate:    PR Interval:    QRS Duration:   QT Interval:    QTC Calculation:   R Axis:     Text Interpretation:        Laboratory Studies: Results for orders placed or performed during the hospital encounter of 05/10/18 (from the past 24 hour(s))  Urinalysis, Routine w reflex microscopic     Status: Abnormal   Collection Time: 05/10/18  6:10 AM  Result Value Ref Range   Color, Urine YELLOW YELLOW   APPearance CLEAR CLEAR   Specific Gravity, Urine >1.030 (H) 1.005 - 1.030   pH 5.5 5.0 - 8.0   Glucose, UA NEGATIVE NEGATIVE mg/dL   Hgb urine dipstick NEGATIVE NEGATIVE   Bilirubin Urine NEGATIVE NEGATIVE   Ketones, ur NEGATIVE NEGATIVE mg/dL   Protein, ur NEGATIVE NEGATIVE mg/dL   Nitrite NEGATIVE NEGATIVE   Leukocytes,Ua NEGATIVE NEGATIVE   Imaging Studies: No results found.  ED COURSE and MDM  Nursing notes and initial vitals signs, including pulse oximetry, reviewed.  Vitals:   05/10/18 0409 05/10/18 0500 05/10/18 0534 05/10/18 0600  BP:   (!) 141/97   Pulse: 62 62  73 66  Resp: 16 19 (!) 23 16  Temp:      TempSrc:      SpO2: 92% 100% 100% 94%  Weight:      Height:       6:56 AM Patient's pain has been controlled with IV fentanyl.  He was also given oral Percocet.  We will give additional fentanyl and discharge him with analgesics and antiemetic.  He will need urology follow-up as a 5 mm stone is unlikely to pass on its own.  Diagnosis is presumptive as no imaging was done given he had a CT of the abdomen and pelvis in January that revealed the likely offending stone.  PROCEDURES    ED DIAGNOSES     ICD-10-CM   1. Ureteral colic N23         Rinda Rollyson, [name], MD 05/10/18 [phone]

## 2018-05-10 NOTE — ED Triage Notes (Signed)
R sided flank x1.5 hours. Known 39mm stone on R side. EMS initiated IV access and gave 200 mcg fentanyl and 4 mg zofran. Pt arrives diaphoretic but endorses relief.

## 2018-05-10 NOTE — ED Notes (Signed)
SpO2 down to 84% after fentanyl administration. Placed patient on 2L [place], oxygen sat improving. Will continue to monitor.

## 2018-05-10 NOTE — ED Notes (Signed)
ED Provider at bedside. 

## 2018-05-10 NOTE — ED Notes (Signed)
Pt attempted to void, unable to pass any urine at this time.

## 2018-06-27 ENCOUNTER — Encounter (HOSPITAL_BASED_OUTPATIENT_CLINIC_OR_DEPARTMENT_OTHER): Payer: Self-pay | Admitting: *Deleted

## 2018-06-27 ENCOUNTER — Other Ambulatory Visit: Payer: Self-pay

## 2018-06-27 ENCOUNTER — Emergency Department (HOSPITAL_BASED_OUTPATIENT_CLINIC_OR_DEPARTMENT_OTHER)
Admission: EM | Admit: 2018-06-27 | Discharge: 2018-06-27 | Disposition: A | Discharge status: Home / Self Care | Payer: BLUE CROSS/BLUE SHIELD | Attending: Emergency Medicine | Admitting: Emergency Medicine

## 2018-06-27 DIAGNOSIS — Z79899 Other long term (current) drug therapy: Secondary | ICD-10-CM | POA: Diagnosis not present

## 2018-06-27 DIAGNOSIS — F419 Anxiety disorder, unspecified: Secondary | ICD-10-CM | POA: Diagnosis present

## 2018-06-27 DIAGNOSIS — F17210 Nicotine dependence, cigarettes, uncomplicated: Secondary | ICD-10-CM | POA: Insufficient documentation

## 2018-06-27 HISTORY — DX: Disorder of kidney and ureter, unspecified: N28.9

## 2018-06-27 MED ORDER — LORAZEPAM 1 MG PO TABS: 1.0000 mg | ORAL_TABLET | Freq: Four times a day (QID) | ORAL | 0 refills | Status: DC | PRN

## 2018-06-27 NOTE — ED Provider Notes (Signed)
[facility] EMERGENCY DEPARTMENT Provider Note   CSN: [id number] Arrival date & time: 06/27/18  0135    History   Chief Complaint Chief Complaint  Patient presents with  . Anxiety    HPI Dalton Fuentes is a 43 y.o. male.     Patient is a 43 year old male presenting with complaints of anxiety.  Patient woke in the night stating that he felt restless and anxious and could not get back to sleep.  He has had this happen in the past on occasion and has been given Ativan in the past.  He has a prescription from 2017 which he had plan to take, however was concerned that the medication had expired.  He denies any suicidal or homicidal ideation.  He denies any auditory or visual hallucinations.  The history is provided by the patient.  Anxiety  This is a recurrent problem. The current episode started 1 to 2 hours ago. The problem has not changed since onset.Nothing aggravates the symptoms. Nothing relieves the symptoms. He has tried nothing for the symptoms.    Past Medical History:  Diagnosis Date  . kidney stones     There are no active problems to display for this patient.   History reviewed. No pertinent surgical history.      Home Medications    Prior to Admission medications   Medication Sig Start Date End Date Taking? Authorizing Provider  HYDROmorphone (DILAUDID) 4 MG tablet Take 0.5-1 tablets (2-4 mg total) by mouth every 4 (four) hours as needed for severe pain. 05/10/18   Molpus, John, MD  omeprazole (PRILOSEC) 40 MG capsule  04/19/18   [provider]  ondansetron (ZOFRAN ODT) 8 MG disintegrating tablet Take 1 tablet (8 mg total) by mouth every 8 (eight) hours as needed for nausea or vomiting. 05/10/18   Molpus, John, MD  tamsulosin (FLOMAX) 0.4 MG CAPS capsule Take 1 tablet daily until stone passes. 05/10/18   Molpus, John, MD    Family History No family history on file.  Social History Social History   Tobacco Use  . Smoking status: Current  Every Day Smoker    Packs/day: 0.50    Types: Cigarettes  . Smokeless tobacco: Never Used  Substance Use Topics  . Alcohol use: No  . Drug use: Yes    Types: Marijuana     Allergies   Patient has no known allergies.   Review of Systems Review of Systems  All other systems reviewed and are negative.    Physical Exam Updated Vital Signs BP 113/77   Pulse 76   Resp 20   Ht 5\' 8"  (1.727 m)   Wt 81.8 kg   SpO2 97%   BMI 27.43 kg/m   Physical Exam Vitals signs and nursing note reviewed.  Constitutional:      General: He is not in acute distress.    Appearance: He is well-developed. He is not diaphoretic.  HENT:     Head: Normocephalic and atraumatic.  Neck:     Musculoskeletal: Normal range of motion and neck supple.  Cardiovascular:     Rate and Rhythm: Normal rate and regular rhythm.     Heart sounds: No murmur. No friction rub.  Pulmonary:     Effort: Pulmonary effort is normal. No respiratory distress.     Breath sounds: Normal breath sounds. No wheezing or rales.  Abdominal:     General: Bowel sounds are normal. There is no distension.     Palpations: Abdomen is  soft.     Tenderness: There is no abdominal tenderness.  Musculoskeletal: Normal range of motion.  Skin:    General: Skin is warm and dry.  Neurological:     Mental Status: He is alert and oriented to person, place, and time.     Coordination: Coordination normal.      ED Treatments / Results  Labs (all labs ordered are listed, but only abnormal results are displayed) Labs Reviewed - No data to display  EKG None  Radiology No results found.  Procedures Procedures (including critical care time)  Medications Ordered in ED Medications - No data to display   Initial Impression / Assessment and Plan / ED Course  I have reviewed the triage vital signs and the nursing notes.  Pertinent labs & imaging results that were available during my care of the patient were reviewed by me and  considered in my medical decision making (see chart for details).  Patient's vital signs stable and exam unremarkable.  Patient has no other complaints other than feeling anxious.  He will be given a small quantity of Ativan which he can take as needed.  As the patient is driving, he was not administered this medication here.  [place] of [place] [employer] [place] shows the patient not having filled any anxiolytics during the past 2 years and I feel as though a small quantity of this medication is appropriate.  Final Clinical Impressions(s) / ED Diagnoses   Final diagnoses:  None    ED Discharge Orders    None       Geoffery Lyonselo, Ellwyn Ergle, MD 06/27/18 [phone]

## 2018-06-27 NOTE — Discharge Instructions (Addendum)
Ativan as prescribed as needed for anxiety.  Follow-up with your primary doctor if you experience any additional issues.

## 2018-06-27 NOTE — ED Triage Notes (Addendum)
Pt states that he fell asleep on his couch. When he woke up around 2230 he felt anxious. States he has had a history of this happening in the past, but states he had a old bottle of ativan but was afraid to take because it was expired. Pt states he drove himself to the ED. Denies any cp/sob.

## 2018-07-10 ENCOUNTER — Encounter (HOSPITAL_BASED_OUTPATIENT_CLINIC_OR_DEPARTMENT_OTHER): Payer: Self-pay | Admitting: Emergency Medicine

## 2018-07-10 ENCOUNTER — Emergency Department (HOSPITAL_BASED_OUTPATIENT_CLINIC_OR_DEPARTMENT_OTHER)
Admission: EM | Admit: 2018-07-10 | Discharge: 2018-07-11 | Disposition: A | Discharge status: Home / Self Care | Payer: BLUE CROSS/BLUE SHIELD | Attending: Emergency Medicine | Admitting: Emergency Medicine

## 2018-07-10 ENCOUNTER — Other Ambulatory Visit: Payer: Self-pay

## 2018-07-10 DIAGNOSIS — Z79899 Other long term (current) drug therapy: Secondary | ICD-10-CM | POA: Diagnosis not present

## 2018-07-10 DIAGNOSIS — F17210 Nicotine dependence, cigarettes, uncomplicated: Secondary | ICD-10-CM | POA: Insufficient documentation

## 2018-07-10 DIAGNOSIS — F419 Anxiety disorder, unspecified: Secondary | ICD-10-CM | POA: Diagnosis present

## 2018-07-10 NOTE — ED Triage Notes (Signed)
Pt states that he is experiencing anxiety. Denies chest pain or shortness of breath. Pt states that he was awaken in panic and was getting evaluated for piece of mind. 2315 took 1 mg ativan that he is prescribed

## 2018-07-10 NOTE — ED Provider Notes (Signed)
TIME SEEN: 11:51 PM  CHIEF COMPLAINT: Anxiety  HPI: Patient is a 43 year old male with history of kidney stones, anxiety who presents to the emergency department stating he just "wants to get checked out" to give him "peace of mind".  States he woke up prior to arrival and felt anxious and has history of anxiety and panic attacks.  Took Ativan prior to arrival and is feeling better.  States sometimes when he feels this way he will come to the emergency department just to get checked out and have his vital signs checked so that he can get reassurance to make him feel better.  He denies any fevers, cough, chest pain, shortness of breath, abdominal pain, vomiting, diarrhea, SI, HI, hallucinations.  ROS: See HPI Constitutional: no fever  Eyes: no drainage  ENT: no runny nose   Cardiovascular:  no chest pain  Resp: no SOB  GI: no vomiting GU: no dysuria Integumentary: no rash  Allergy: no hives  Musculoskeletal: no leg swelling  Neurological: no slurred speech ROS otherwise negative  PAST MEDICAL HISTORY/PAST SURGICAL HISTORY:  Past Medical History:  Diagnosis Date  . kidney stones     MEDICATIONS:  Prior to Admission medications   Medication Sig Start Date End Date Taking? Authorizing Provider  HYDROmorphone (DILAUDID) 4 MG tablet Take 0.5-1 tablets (2-4 mg total) by mouth every 4 (four) hours as needed for severe pain. 05/10/18   Molpus, John, MD  LORazepam (ATIVAN) 1 MG tablet Take 1 tablet (1 mg total) by mouth every 6 (six) hours as needed for anxiety. 06/27/18   Geoffery Lyons, MD  omeprazole (PRILOSEC) 40 MG capsule  04/19/18   [provider]  ondansetron (ZOFRAN ODT) 8 MG disintegrating tablet Take 1 tablet (8 mg total) by mouth every 8 (eight) hours as needed for nausea or vomiting. 05/10/18   Molpus, John, MD  tamsulosin (FLOMAX) 0.4 MG CAPS capsule Take 1 tablet daily until stone passes. 05/10/18   Molpus, John, MD    ALLERGIES:  No Known Allergies  SOCIAL HISTORY:   Social History   Tobacco Use  . Smoking status: Current Every Day Smoker    Packs/day: 0.50    Types: Cigarettes  . Smokeless tobacco: Never Used  Substance Use Topics  . Alcohol use: No    FAMILY HISTORY: No family history on file.  EXAM: BP 106/78   Pulse 81   Temp 97.9 F (36.6 C) (Oral)   Resp 18   Ht 5\' 8"  (1.727 m)   Wt 81.8 kg   SpO2 97%   BMI 27.42 kg/m  CONSTITUTIONAL: Alert and oriented and responds appropriately to questions. Well-appearing; well-nourished HEAD: Normocephalic EYES: Conjunctivae clear, pupils appear equal, EOMI ENT: normal nose; moist mucous membranes NECK: Supple, no meningismus, no nuchal rigidity, no LAD  CARD: RRR; S1 and S2 appreciated; no murmurs, no clicks, no rubs, no gallops RESP: Normal chest excursion without splinting or tachypnea; breath sounds clear and equal bilaterally; no wheezes, no rhonchi, no rales, no hypoxia or respiratory distress, speaking full sentences ABD/GI: Normal bowel sounds; non-distended; soft, non-tender, no rebound, no guarding, no peritoneal signs, no hepatosplenomegaly BACK:  The back appears normal and is non-tender to palpation, there is no CVA tenderness EXT: Normal ROM in all joints; non-tender to palpation; no edema; normal capillary refill; no cyanosis, no calf tenderness or swelling    SKIN: Normal color for age and race; warm; no rash NEURO: Moves all extremities equally PSYCH: The patient's mood and manner are appropriate.  Grooming and personal hygiene are appropriate.  MEDICAL DECISION MAKING: Patient here with anxiety that is improving with his home medications.  States he wanted to "just get checked out" for reassurance.  His exam is unremarkable here.  Vital signs are normal.  He has no symptoms.  No psychiatric safety concerns.  I feel he can be discharged safely.  Patient feels reassured and is thankful for care.  At this time, I do not feel there is any life-threatening condition present. I have  reviewed and discussed all results (EKG, imaging, lab, urine as appropriate) and exam findings with patient/family. I have reviewed nursing notes and appropriate previous records.  I feel the patient is safe to be discharged home without further emergent workup and can continue workup as an outpatient as needed. Discussed usual and customary return precautions. Patient/family verbalize understanding and are comfortable with this plan.  Outpatient follow-up has been provided as needed. All questions have been answered.      Kayah Hecker, Layla MawKristen N, DO 07/10/18 2359

## 2018-08-23 ENCOUNTER — Emergency Department (HOSPITAL_BASED_OUTPATIENT_CLINIC_OR_DEPARTMENT_OTHER)
Admission: EM | Admit: 2018-08-23 | Discharge: 2018-08-23 | Disposition: A | Discharge status: Home / Self Care | Payer: BLUE CROSS/BLUE SHIELD | Attending: Emergency Medicine | Admitting: Emergency Medicine

## 2018-08-23 ENCOUNTER — Other Ambulatory Visit: Payer: Self-pay

## 2018-08-23 ENCOUNTER — Encounter (HOSPITAL_BASED_OUTPATIENT_CLINIC_OR_DEPARTMENT_OTHER): Payer: Self-pay | Admitting: *Deleted

## 2018-08-23 DIAGNOSIS — F419 Anxiety disorder, unspecified: Secondary | ICD-10-CM | POA: Insufficient documentation

## 2018-08-23 DIAGNOSIS — G4700 Insomnia, unspecified: Secondary | ICD-10-CM | POA: Diagnosis present

## 2018-08-23 DIAGNOSIS — F5101 Primary insomnia: Secondary | ICD-10-CM

## 2018-08-23 DIAGNOSIS — F17210 Nicotine dependence, cigarettes, uncomplicated: Secondary | ICD-10-CM | POA: Diagnosis not present

## 2018-08-23 DIAGNOSIS — Z79899 Other long term (current) drug therapy: Secondary | ICD-10-CM | POA: Diagnosis not present

## 2018-08-23 NOTE — ED Triage Notes (Signed)
Insomnia and anxiety tonight.

## 2018-08-23 NOTE — ED Provider Notes (Signed)
[facility] EMERGENCY DEPARTMENT Provider Note   CSN: [id number] Arrival date & time: 08/23/18  0134     History   Chief Complaint Chief Complaint  Patient presents with  . Insomnia    HPI Dalton Fuentes is a 43 y.o. male.     HPI  This is a 43 year old male who presents with anxiety and insomnia.  Patient reports that he previously was on Ativan as needed for insomnia.  He was transition to hydroxyzine.  He states he took 30 mg tonight but had trouble sleeping.  He did fall asleep for a brief period of time and then woke up anxious.  He states that he has sleep apnea and is waiting on a sleep study.  He wakes from sleep frequently feeling anxious regarding his apnea.  He is requesting check of his vital signs to "make me feel better."  He denies any current symptoms including chest pain, shortness of breath, palpitations, nausea, vomiting.  Past Medical History:  Diagnosis Date  . kidney stones     There are no active problems to display for this patient.   History reviewed. No pertinent surgical history.      Home Medications    Prior to Admission medications   Medication Sig Start Date End Date Taking? Authorizing Provider  HYDROmorphone (DILAUDID) 4 MG tablet Take 0.5-1 tablets (2-4 mg total) by mouth every 4 (four) hours as needed for severe pain. 05/10/18   Molpus, John, MD  LORazepam (ATIVAN) 1 MG tablet Take 1 tablet (1 mg total) by mouth every 6 (six) hours as needed for anxiety. 06/27/18   Veryl Speak, MD  omeprazole (PRILOSEC) 40 MG capsule  04/19/18   [provider]  ondansetron (ZOFRAN ODT) 8 MG disintegrating tablet Take 1 tablet (8 mg total) by mouth every 8 (eight) hours as needed for nausea or vomiting. 05/10/18   Molpus, John, MD  tamsulosin (FLOMAX) 0.4 MG CAPS capsule Take 1 tablet daily until stone passes. 05/10/18   Molpus, Jenny Reichmann, MD    Family History History reviewed. No pertinent family history.  Social History Social History    Tobacco Use  . Smoking status: Current Every Day Smoker    Packs/day: 0.50    Types: Cigarettes  . Smokeless tobacco: Never Used  Substance Use Topics  . Alcohol use: No  . Drug use: Yes    Types: Marijuana     Allergies   Patient has no known allergies.   Review of Systems Review of Systems  Constitutional: Negative for fever.  Respiratory: Negative for shortness of breath.   Cardiovascular: Negative for chest pain and palpitations.  Psychiatric/Behavioral: Positive for sleep disturbance. The patient is nervous/anxious.   All other systems reviewed and are negative.    Physical Exam Updated Vital Signs BP 137/81 (BP Location: Left Arm)   Pulse 71   Temp 98 F (36.7 C) (Oral)   Resp 18   Ht 1.727 m (5\' 8" )   Wt 83.9 kg   SpO2 100%   BMI 28.13 kg/m   Physical Exam Vitals signs and nursing note reviewed.  Constitutional:      Appearance: He is well-developed. He is not ill-appearing.  HENT:     Head: Normocephalic and atraumatic.  Neck:     Musculoskeletal: Neck supple.  Cardiovascular:     Rate and Rhythm: Normal rate and regular rhythm.     Heart sounds: Normal heart sounds. No murmur.  Pulmonary:     Effort: Pulmonary effort  is normal. No respiratory distress.     Breath sounds: Normal breath sounds. No wheezing.  Skin:    General: Skin is warm and dry.  Neurological:     Mental Status: He is alert and oriented to person, place, and time.  Psychiatric:        Mood and Affect: Mood normal.      ED Treatments / Results  Labs (all labs ordered are listed, but only abnormal results are displayed) Labs Reviewed - No data to display  EKG None  Radiology No results found.  Procedures Procedures (including critical care time)  Medications Ordered in ED Medications - No data to display   Initial Impression / Assessment and Plan / ED Course  I have reviewed the triage vital signs and the nursing notes.  Pertinent labs & imaging results  that were available during my care of the patient were reviewed by me and considered in my medical decision making (see chart for details).        Patient presents with ongoing insomnia and anxiety.  He is overall nontoxic and vital signs are reassuring.  He is requesting vital sign recheck, to "reassure him."  We discussed additional options for sleep medications.  Unfortunately, he drove himself and I am unable to give him any medications at this time.  He will follow-up closely with his primary physician and pursue sleep study.  After history, exam, and medical workup I feel the patient has been appropriately medically screened and is safe for discharge home. Pertinent diagnoses were discussed with the patient. Patient was given return precautions.  Final Clinical Impressions(s) / ED Diagnoses   Final diagnoses:  Primary insomnia  Anxiety    ED Discharge Orders    None       Shon BatonHorton, Graclyn Lawther F, MD 08/23/18 [phone]

## 2018-08-23 NOTE — Discharge Instructions (Addendum)
You were seen today for check of your vital signs.  Your vital signs are reassuring.  Regarding her anxiety and ongoing insomnia, follow-up closely with your primary physician and pursue sleep study.

## 2018-08-28 ENCOUNTER — Encounter (HOSPITAL_BASED_OUTPATIENT_CLINIC_OR_DEPARTMENT_OTHER): Payer: Self-pay

## 2018-08-28 ENCOUNTER — Other Ambulatory Visit: Payer: Self-pay

## 2018-08-28 ENCOUNTER — Emergency Department (HOSPITAL_BASED_OUTPATIENT_CLINIC_OR_DEPARTMENT_OTHER)
Admission: EM | Admit: 2018-08-28 | Discharge: 2018-08-28 | Disposition: A | Discharge status: Home / Self Care | Payer: BLUE CROSS/BLUE SHIELD | Attending: Emergency Medicine | Admitting: Emergency Medicine

## 2018-08-28 DIAGNOSIS — F064 Anxiety disorder due to known physiological condition: Secondary | ICD-10-CM | POA: Diagnosis not present

## 2018-08-28 DIAGNOSIS — F17210 Nicotine dependence, cigarettes, uncomplicated: Secondary | ICD-10-CM | POA: Insufficient documentation

## 2018-08-28 DIAGNOSIS — G4733 Obstructive sleep apnea (adult) (pediatric): Secondary | ICD-10-CM | POA: Diagnosis not present

## 2018-08-28 DIAGNOSIS — F410 Panic disorder [episodic paroxysmal anxiety] without agoraphobia: Secondary | ICD-10-CM | POA: Insufficient documentation

## 2018-08-28 DIAGNOSIS — Z79899 Other long term (current) drug therapy: Secondary | ICD-10-CM | POA: Insufficient documentation

## 2018-08-28 DIAGNOSIS — F419 Anxiety disorder, unspecified: Secondary | ICD-10-CM | POA: Diagnosis present

## 2018-08-28 MED ORDER — LORAZEPAM 1 MG PO TABS: 1.0000 mg | ORAL_TABLET | Freq: Every evening | ORAL | 0 refills | Status: DC | PRN

## 2018-08-28 MED ORDER — LORAZEPAM 1 MG PO TABS
1.0000 mg | ORAL_TABLET | Freq: Once | ORAL | Status: AC
  Administered 2018-08-28: 1 mg via ORAL
  Filled 2018-08-28: qty 1

## 2018-08-28 NOTE — ED Notes (Signed)
Pt understood dc material. Script given at dc. All questions answered to satisfaction. Pt escorted to check out counter.

## 2018-08-28 NOTE — ED Notes (Signed)
Pt reports trying to use vicks vapor rub on chest and taking tylenol sleep medication at 11pm.

## 2018-08-28 NOTE — ED Provider Notes (Signed)
[facility] DEPT [facility] Provider Note: Georgena Spurling, MD, FACEP  CSN: [id number] MRN: [id number] ARRIVAL: 08/28/18 at [place]: [id number]   CHIEF COMPLAINT  Anxiety   HISTORY OF PRESENT ILLNESS  08/28/18 12:51 AM Dalton Fuentes is a 43 y.o. male with a history of suspected sleep apnea; a sleep study is pending later this month.  He is complaining of episodes of snoring that wake him up.  When he awakens he experiences what he describes as a panic attack with rapid heart rate, anxiety and inability to fall back to sleep.  As a result of sleep deprivation he suffers daytime fatigue.  He was previously prescribed Ativan by myself with improvement.  His PCP switched him to hydroxyzine which has not helped.  His most recent episode was late yesterday evening.  He continues to be anxious.   Past Medical History:  Diagnosis Date  . kidney stones     History reviewed. No pertinent surgical history.  No family history on file.  Social History   Tobacco Use  . Smoking status: Current Every Day Smoker    Packs/day: 0.50    Types: Cigarettes  . Smokeless tobacco: Never Used  Substance Use Topics  . Alcohol use: No  . Drug use: Yes    Types: Marijuana    Prior to Admission medications   Medication Sig Start Date End Date Taking? Authorizing Provider  HYDROmorphone (DILAUDID) 4 MG tablet Take 0.5-1 tablets (2-4 mg total) by mouth every 4 (four) hours as needed for severe pain. 05/10/18   Tyliah Schlereth, MD  LORazepam (ATIVAN) 1 MG tablet Take 1 tablet (1 mg total) by mouth every 6 (six) hours as needed for anxiety. 06/27/18   Veryl Speak, MD  omeprazole (PRILOSEC) 40 MG capsule  04/19/18   [provider]  ondansetron (ZOFRAN ODT) 8 MG disintegrating tablet Take 1 tablet (8 mg total) by mouth every 8 (eight) hours as needed for nausea or vomiting. 05/10/18   Happy Ky, MD  tamsulosin (FLOMAX) 0.4 MG CAPS capsule Take 1 tablet daily until stone passes. 05/10/18   Murial Beam,  MD    Allergies Patient has no known allergies.   REVIEW OF SYSTEMS  Negative except as noted here or in the History of Present Illness.   PHYSICAL EXAMINATION  Initial Vital Signs Blood pressure (!) 135/100, pulse 82, resp. rate 17, height 5\' 9"  (1.753 m), weight 81.6 kg, SpO2 99 %.  Examination General: Well-developed, well-nourished male in no acute distress; appearance consistent with age of record HENT: normocephalic; atraumatic Eyes: Normal appearance Neck: supple Heart: regular rate and rhythm Lungs: clear to auscultation bilaterally Abdomen: soft; nondistended; nontender; bowel sounds present Extremities: No deformity; full range of motion Neurologic: Awake, alert and oriented; motor function intact in all extremities and symmetric; no facial droop Skin: Warm and dry Psychiatric: Anxious   RESULTS  Summary of this visit's results, reviewed by myself:   EKG Interpretation  Date/Time:    Ventricular Rate:    PR Interval:    QRS Duration:   QT Interval:    QTC Calculation:   R Axis:     Text Interpretation:        Laboratory Studies: No results found for this or any previous visit (from the past 24 hour(s)). Imaging Studies: No results found.  ED COURSE and MDM  Nursing notes and initial vitals signs, including pulse oximetry, reviewed.  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 Obstructive sleep apnea  G47.33        Ivalee Strauser, Jonny RuizJohn, MD 08/28/18 [id number]

## 2018-08-28 NOTE — ED Triage Notes (Signed)
Pt reports having sleep apnea and gets woken up and then feels like he cannot breathe and then goes into a panic attack. Pt has apt for sleep study at end of month. Pt reports taking vistaril x 5 days with no relief.

## 2018-09-06 ENCOUNTER — Other Ambulatory Visit: Payer: Self-pay

## 2018-09-06 ENCOUNTER — Encounter (HOSPITAL_BASED_OUTPATIENT_CLINIC_OR_DEPARTMENT_OTHER): Payer: Self-pay | Admitting: *Deleted

## 2018-09-06 ENCOUNTER — Emergency Department (HOSPITAL_BASED_OUTPATIENT_CLINIC_OR_DEPARTMENT_OTHER)
Admission: EM | Admit: 2018-09-06 | Discharge: 2018-09-07 | Disposition: A | Discharge status: Home / Self Care | Payer: BLUE CROSS/BLUE SHIELD | Attending: Emergency Medicine | Admitting: Emergency Medicine

## 2018-09-06 DIAGNOSIS — F17210 Nicotine dependence, cigarettes, uncomplicated: Secondary | ICD-10-CM | POA: Insufficient documentation

## 2018-09-06 DIAGNOSIS — Z79899 Other long term (current) drug therapy: Secondary | ICD-10-CM | POA: Diagnosis not present

## 2018-09-06 DIAGNOSIS — G4700 Insomnia, unspecified: Secondary | ICD-10-CM

## 2018-09-06 DIAGNOSIS — F419 Anxiety disorder, unspecified: Secondary | ICD-10-CM | POA: Diagnosis present

## 2018-09-06 HISTORY — DX: Anxiety disorder, unspecified: F41.9

## 2018-09-06 HISTORY — DX: Insomnia, unspecified: G47.00

## 2018-09-06 NOTE — ED Notes (Signed)
States he normally gets an Ativan and a Rx then leaves. Wants to know if we can just put him in a room to have that done. Explained we have no available rooms open and he would need to be evaluated by the EDP here tonight.

## 2018-09-06 NOTE — ED Triage Notes (Signed)
Per pt he states he is having his same old complaint of insomnia and anxiety.

## 2018-09-06 NOTE — ED Provider Notes (Signed)
[facility] EMERGENCY DEPARTMENT Provider Note   CSN: [id number] Arrival date & time: 09/06/18  2157    History   Chief Complaint Chief Complaint  Patient presents with  . Anxiety    HPI Dalton Fuentes is a 43 y.o. male.     Patient presents to the emergency department for evaluation of anxiety.  Patient reports that he has a history of severe sleep apnea is currently being worked up and obtaining a CPAP machine.  He reports that he often snores so violently that it wakes him up and then he becomes very anxious and has trouble going back to sleep.  He reports that this has happened a couple of times in the past and he has been given Ativan to help with his anxiety and to help him sleep.     Past Medical History:  Diagnosis Date  . Anxiety   . Insomnia   . kidney stones     There are no active problems to display for this patient.   History reviewed. No pertinent surgical history.      Home Medications    Prior to Admission medications   Medication Sig Start Date End Date Taking? Authorizing Provider  HYDROmorphone (DILAUDID) 4 MG tablet Take 0.5-1 tablets (2-4 mg total) by mouth every 4 (four) hours as needed for severe pain. 05/10/18   Molpus, John, MD  LORazepam (ATIVAN) 1 MG tablet Take 1 tablet (1 mg total) by mouth at bedtime as needed for anxiety or sleep. 08/28/18   Molpus, Jenny Reichmann, MD  omeprazole (PRILOSEC) 40 MG capsule  04/19/18   [provider]  ondansetron (ZOFRAN ODT) 8 MG disintegrating tablet Take 1 tablet (8 mg total) by mouth every 8 (eight) hours as needed for nausea or vomiting. 05/10/18   Molpus, John, MD  tamsulosin (FLOMAX) 0.4 MG CAPS capsule Take 1 tablet daily until stone passes. 05/10/18   Molpus, John, MD    Family History No family history on file.  Social History Social History   Tobacco Use  . Smoking status: Current Every Day Smoker    Packs/day: 0.50    Types: Cigarettes  . Smokeless tobacco: Never Used   Substance Use Topics  . Alcohol use: No  . Drug use: Yes    Types: Marijuana     Allergies   Patient has no known allergies.   Review of Systems Review of Systems  Psychiatric/Behavioral: Positive for sleep disturbance. Negative for suicidal ideas. The patient is nervous/anxious.   All other systems reviewed and are negative.    Physical Exam Updated Vital Signs BP (!) 122/94   Pulse 80   Temp 98.2 F (36.8 C) (Oral)   Resp 16   Ht 5\' 8"  (1.727 m)   Wt 83.9 kg   SpO2 98%   BMI 28.13 kg/m   Physical Exam Vitals signs and nursing note reviewed.  Constitutional:      General: He is not in acute distress.    Appearance: Normal appearance. He is well-developed.  HENT:     Head: Normocephalic and atraumatic.     Right Ear: Hearing normal.     Left Ear: Hearing normal.     Nose: Nose normal.  Eyes:     Conjunctiva/sclera: Conjunctivae normal.     Pupils: Pupils are equal, round, and reactive to light.  Neck:     Musculoskeletal: Normal range of motion and neck supple.  Cardiovascular:     Rate and Rhythm: Regular rhythm.  Heart sounds: S1 normal and S2 normal. No murmur. No friction rub. No gallop.   Pulmonary:     Effort: Pulmonary effort is normal. No respiratory distress.     Breath sounds: Normal breath sounds.  Chest:     Chest wall: No tenderness.  Abdominal:     General: Bowel sounds are normal.     Palpations: Abdomen is soft.     Tenderness: There is no abdominal tenderness. There is no guarding or rebound. Negative signs include Murphy's sign and McBurney's sign.     Hernia: No hernia is present.  Musculoskeletal: Normal range of motion.  Skin:    General: Skin is warm and dry.     Findings: No rash.  Neurological:     Mental Status: He is alert and oriented to person, place, and time.     GCS: GCS eye subscore is 4. GCS verbal subscore is 5. GCS motor subscore is 6.     Cranial Nerves: No cranial nerve deficit.     Sensory: No sensory  deficit.     Coordination: Coordination normal.  Psychiatric:        Speech: Speech normal.        Behavior: Behavior normal.        Thought Content: Thought content normal.      ED Treatments / Results  Labs (all labs ordered are listed, but only abnormal results are displayed) Labs Reviewed - No data to display  EKG None  Radiology No results found.  Procedures Procedures (including critical care time)  Medications Ordered in ED Medications  LORazepam (ATIVAN) tablet 1 mg (has no administration in time range)     Initial Impression / Assessment and Plan / ED Course  I have reviewed the triage vital signs and the nursing notes.  Pertinent labs & imaging results that were available during my care of the patient were reviewed by me and considered in my medical decision making (see chart for details).        Discussed with patient that he has been seen a couple of times in the ER for this.  At this point I would suggest that he follow-up with his primary doctor to obtain any further benzodiazepine prescriptions.  I told him that we would be unlikely to give him further prescriptions out of this ER and he tells me that he has a follow-up appointment already made.  He understands that he needs to get his narcotics from one particular doctor.  Will be given a limited supply today.  Final Clinical Impressions(s) / ED Diagnoses   Final diagnoses:  Anxiety  Insomnia, unspecified type    ED Discharge Orders    None       Jane Birkel, Canary Brimhristopher J, MD 09/07/18 0005

## 2018-09-07 MED ORDER — LORAZEPAM 1 MG PO TABS
1.0000 mg | ORAL_TABLET | Freq: Once | ORAL | Status: AC
  Administered 2018-09-07: 1 mg via ORAL
  Filled 2018-09-07: qty 1

## 2018-09-07 MED ORDER — LORAZEPAM 1 MG PO TABS: 1.0000 mg | ORAL_TABLET | Freq: Every evening | ORAL | 0 refills | Status: DC | PRN

## 2018-10-03 ENCOUNTER — Encounter (HOSPITAL_BASED_OUTPATIENT_CLINIC_OR_DEPARTMENT_OTHER): Payer: Self-pay | Admitting: *Deleted

## 2018-10-03 ENCOUNTER — Emergency Department (HOSPITAL_BASED_OUTPATIENT_CLINIC_OR_DEPARTMENT_OTHER)
Admission: EM | Admit: 2018-10-03 | Discharge: 2018-10-03 | Disposition: A | Discharge status: Home / Self Care | Payer: BLUE CROSS/BLUE SHIELD | Attending: Emergency Medicine | Admitting: Emergency Medicine

## 2018-10-03 ENCOUNTER — Other Ambulatory Visit: Payer: Self-pay

## 2018-10-03 DIAGNOSIS — G4700 Insomnia, unspecified: Secondary | ICD-10-CM | POA: Diagnosis not present

## 2018-10-03 DIAGNOSIS — Z765 Malingerer [conscious simulation]: Secondary | ICD-10-CM | POA: Diagnosis not present

## 2018-10-03 DIAGNOSIS — F17210 Nicotine dependence, cigarettes, uncomplicated: Secondary | ICD-10-CM | POA: Insufficient documentation

## 2018-10-03 HISTORY — DX: Sleep apnea, unspecified: G47.30

## 2018-10-03 MED ORDER — DIPHENHYDRAMINE HCL 25 MG PO CAPS
25.0000 mg | ORAL_CAPSULE | ORAL | Status: AC
  Administered 2018-10-03: 25 mg via ORAL
  Filled 2018-10-03: qty 1

## 2018-10-03 NOTE — ED Triage Notes (Signed)
Pt has had a sleep study and been prescribed a CPAP which is pending. States he has been having difficulty sleeping despite taking hydoxyzine

## 2018-10-03 NOTE — ED Provider Notes (Signed)
[facility] EMERGENCY DEPARTMENT Provider Note   CSN: [id number] Arrival date & time: 10/03/18  0103     History   Chief Complaint Chief Complaint  Patient presents with  . Insomnia    HPI Dalton Fuentes is a 43 y.o. male.     The history is provided by the patient.  Insomnia This is a chronic problem. The current episode started more than 1 week ago. The problem occurs constantly. The problem has not changed since onset.Pertinent negatives include no chest pain, no abdominal pain, no headaches and no shortness of breath. Nothing aggravates the symptoms. Nothing relieves the symptoms. Treatments tried: the only thing that worked is ativan and dilaudid, but he doesn't have any. The treatment provided no relief.    Past Medical History:  Diagnosis Date  . Anxiety   . Insomnia   . kidney stones   . Sleep apnea     There are no active problems to display for this patient.   History reviewed. No pertinent surgical history.      Home Medications    Prior to Admission medications   Medication Sig Start Date End Date Taking? Authorizing Provider  HYDROmorphone (DILAUDID) 4 MG tablet Take 0.5-1 tablets (2-4 mg total) by mouth every 4 (four) hours as needed for severe pain. 05/10/18   Molpus, John, MD  LORazepam (ATIVAN) 1 MG tablet Take 1 tablet (1 mg total) by mouth at bedtime as needed for anxiety or sleep. 09/07/18   Gilda CreasePollina, Christopher J, MD  omeprazole (PRILOSEC) 40 MG capsule  04/19/18   [provider]  ondansetron (ZOFRAN ODT) 8 MG disintegrating tablet Take 1 tablet (8 mg total) by mouth every 8 (eight) hours as needed for nausea or vomiting. 05/10/18   Molpus, John, MD  tamsulosin (FLOMAX) 0.4 MG CAPS capsule Take 1 tablet daily until stone passes. 05/10/18   Molpus, John, MD    Family History No family history on file.  Social History Social History   Tobacco Use  . Smoking status: Current Every Day Smoker    Packs/day: 0.50    Types:  Cigarettes  . Smokeless tobacco: Never Used  Substance Use Topics  . Alcohol use: No  . Drug use: Yes    Types: Marijuana     Allergies   Patient has no known allergies.   Review of Systems Review of Systems  Constitutional: Negative for fever.  HENT: Negative for congestion.   Eyes: Negative for visual disturbance.  Respiratory: Negative for shortness of breath.   Cardiovascular: Negative for chest pain.  Gastrointestinal: Negative for abdominal pain.  Genitourinary: Negative for difficulty urinating.  Musculoskeletal: Negative for arthralgias.  Neurological: Negative for headaches.  Psychiatric/Behavioral: The patient has insomnia.   All other systems reviewed and are negative.    Physical Exam Updated Vital Signs BP 138/84 (BP Location: Right Arm)   Pulse 88   Temp 98.5 F (36.9 C) (Oral)   Resp 16   Ht 5\' 8"  (1.727 m)   Wt 81.6 kg   SpO2 99%   BMI 27.37 kg/m   Physical Exam Vitals signs and nursing note reviewed.  Constitutional:      Appearance: He is normal weight.  HENT:     Head: Normocephalic and atraumatic.     Nose: Nose normal.  Eyes:     Conjunctiva/sclera: Conjunctivae normal.     Pupils: Pupils are equal, round, and reactive to light.  Neck:     Musculoskeletal: Normal range of motion  and neck supple.  Cardiovascular:     Rate and Rhythm: Normal rate and regular rhythm.     Pulses: Normal pulses.     Heart sounds: Normal heart sounds.  Pulmonary:     Effort: Pulmonary effort is normal.     Breath sounds: Normal breath sounds.  Abdominal:     General: Abdomen is flat. Bowel sounds are normal.     Tenderness: There is no abdominal tenderness.  Musculoskeletal: Normal range of motion.  Skin:    General: Skin is warm and dry.     Capillary Refill: Capillary refill takes less than 2 seconds.  Neurological:     General: No focal deficit present.     Mental Status: He is alert and oriented to person, place, and time.  Psychiatric:         Mood and Affect: Mood normal.        Behavior: Behavior normal.      ED Treatments / Results  Labs (all labs ordered are listed, but only abnormal results are displayed) Labs Reviewed - No data to display  EKG None  Radiology No results found.  Procedures Procedures (including critical care time)  Medications Ordered in ED Medications  diphenhydrAMINE (BENADRYL) capsule 25 mg (25 mg Oral Given 10/03/18 0231)    This patient is repeatedly visiting the ED for narcotics and benzos "help him sleep" and is now asking for drugs by name.  This is not appropriate and not indicated for insomnia.  She states nothing over the counter works for him. There is no emergency medical condition at this time.    [name] was evaluated in Emergency Department on 10/03/2018 for the symptoms described in the history of present illness. He was evaluated in the context of the global COVID-19 pandemic, which necessitated consideration that the patient might be at risk for infection with the SARS-CoV-2 virus that causes COVID-19. Institutional protocols and algorithms that pertain to the evaluation of patients at risk for COVID-19 are in a state of rapid change based on information released by regulatory bodies including the CDC and federal and state organizations. These policies and algorithms were followed during the patient's care in the ED.  Final Clinical Impressions(s) / ED Diagnoses   Final diagnoses:  Insomnia, unspecified type  Drug-seeking behavior    Return for intractable cough, coughing up blood,fevers >100.4 unrelieved by medication, shortness of breath, intractable vomiting, chest pain, shortness of breath, weakness,numbness, changes in speech, facial asymmetry,abdominal pain, passing out,Inability to tolerate liquids or food, cough, altered mental status or any concerns. No signs of systemic illness or infection. The patient is nontoxic-appearing on exam and vital signs are  within normal limits.   I have reviewed the triage vital signs and the nursing notes. Pertinent labs &imaging results that were available during my care of the patient were reviewed by me and considered in my medical decision making (see chart for details).  After history, exam, and medical workup I feel the patient has been appropriately medically screened and is safe for discharge home. Pertinent diagnoses were discussed with the patient. Patient was given return precautions   Aleiyah Halpin, MD 10/03/18 [phone]

## 2018-10-16 ENCOUNTER — Emergency Department (HOSPITAL_BASED_OUTPATIENT_CLINIC_OR_DEPARTMENT_OTHER)
Admission: EM | Admit: 2018-10-16 | Discharge: 2018-10-16 | Disposition: A | Discharge status: Home / Self Care | Payer: BLUE CROSS/BLUE SHIELD | Attending: Emergency Medicine | Admitting: Emergency Medicine

## 2018-10-16 ENCOUNTER — Other Ambulatory Visit: Payer: Self-pay

## 2018-10-16 ENCOUNTER — Encounter (HOSPITAL_BASED_OUTPATIENT_CLINIC_OR_DEPARTMENT_OTHER): Payer: Self-pay | Admitting: Emergency Medicine

## 2018-10-16 DIAGNOSIS — F17210 Nicotine dependence, cigarettes, uncomplicated: Secondary | ICD-10-CM | POA: Insufficient documentation

## 2018-10-16 DIAGNOSIS — F419 Anxiety disorder, unspecified: Secondary | ICD-10-CM | POA: Insufficient documentation

## 2018-10-16 NOTE — ED Provider Notes (Signed)
[facility] EMERGENCY DEPARTMENT Provider Note   CSN: [id number] Arrival date & time: 10/16/18  0306     History   Chief Complaint Chief Complaint  Patient presents with  . Panic Attack    HPI Dalton Fuentes is a 43 y.o. male.     The history is provided by the patient.  Anxiety This is a recurrent problem. The current episode started less than 1 hour ago. The problem occurs constantly. The problem has not changed since onset.Pertinent negatives include no chest pain, no abdominal pain, no headaches and no shortness of breath. Nothing aggravates the symptoms. He has tried nothing for the symptoms. The treatment provided no relief.  Patient well known to the ED and me personally for anxiety and insomnia presents with same.  Got his new CPAP machine and awoke feeling anxious.  No CP no SOB no palpitations.  No f/c/r.    Past Medical History:  Diagnosis Date  . Anxiety   . Insomnia   . kidney stones   . Sleep apnea     There are no active problems to display for this patient.   History reviewed. No pertinent surgical history.      Home Medications    Prior to Admission medications   Medication Sig Start Date End Date Taking? Authorizing Provider  HYDROmorphone (DILAUDID) 4 MG tablet Take 0.5-1 tablets (2-4 mg total) by mouth every 4 (four) hours as needed for severe pain. 05/10/18   Molpus, John, MD  LORazepam (ATIVAN) 1 MG tablet Take 1 tablet (1 mg total) by mouth at bedtime as needed for anxiety or sleep. 09/07/18   Orpah Greek, MD  omeprazole (PRILOSEC) 40 MG capsule  04/19/18   [provider]  ondansetron (ZOFRAN ODT) 8 MG disintegrating tablet Take 1 tablet (8 mg total) by mouth every 8 (eight) hours as needed for nausea or vomiting. 05/10/18   Molpus, John, MD  tamsulosin (FLOMAX) 0.4 MG CAPS capsule Take 1 tablet daily until stone passes. 05/10/18   Molpus, John, MD    Family History No family history on file.  Social History  Social History   Tobacco Use  . Smoking status: Current Every Day Smoker    Packs/day: 0.50    Types: Cigarettes  . Smokeless tobacco: Never Used  Substance Use Topics  . Alcohol use: No  . Drug use: Yes    Types: Marijuana     Allergies   Patient has no known allergies.   Review of Systems Review of Systems  Constitutional: Negative for fever.  HENT: Negative for congestion.   Eyes: Negative for visual disturbance.  Respiratory: Negative for cough, chest tightness and shortness of breath.   Cardiovascular: Negative for chest pain, palpitations and leg swelling.  Gastrointestinal: Negative for abdominal pain.  Genitourinary: Negative for difficulty urinating.  Musculoskeletal: Negative for joint swelling.  Neurological: Negative for weakness, numbness and headaches.  Psychiatric/Behavioral: The patient is nervous/anxious.   All other systems reviewed and are negative.    Physical Exam Updated Vital Signs BP (!) 133/94 (BP Location: Right Arm)   Pulse 79   Temp 98 F (36.7 C) (Oral)   Resp 18   SpO2 100%   Physical Exam Vitals signs and nursing note reviewed.  Constitutional:      General: He is not in acute distress.    Appearance: He is normal weight.  HENT:     Head: Normocephalic and atraumatic.     Nose: Nose normal. No congestion.  Eyes:     Extraocular Movements: Extraocular movements intact.     Conjunctiva/sclera: Conjunctivae normal.     Pupils: Pupils are equal, round, and reactive to light.  Neck:     Musculoskeletal: Normal range of motion and neck supple.  Cardiovascular:     Rate and Rhythm: Normal rate and regular rhythm.     Pulses: Normal pulses.     Heart sounds: Normal heart sounds.  Pulmonary:     Effort: Pulmonary effort is normal.     Breath sounds: Normal breath sounds.  Abdominal:     General: Abdomen is flat. Bowel sounds are normal.     Tenderness: There is no abdominal tenderness. There is no guarding.  Musculoskeletal:  Normal range of motion.  Skin:    General: Skin is warm and dry.     Capillary Refill: Capillary refill takes less than 2 seconds.  Neurological:     General: No focal deficit present.     Mental Status: He is alert and oriented to person, place, and time.  Psychiatric:        Mood and Affect: Mood is anxious.      ED Treatments / Results  Labs (all labs ordered are listed, but only abnormal results are displayed) Labs Reviewed - No data to display  EKG None  Radiology No results found.  Procedures Procedures (including critical care time)  Medications Ordered in ED Medications - No data to display  We had a long discussion about not eating before bed, no greasy spicy foods and sleeping with the head of the bed up.  We also discussed CPAP usage and care. He feels better knowing his vitals are stable and appreciates the discussion.  We also talked at length about following up with his PMD to discuss an SSRI as it was help with anxiety and not be habit forming.  I see no signs of an acute emergency   Final Clinical Impressions(s) / ED Diagnoses   Final diagnoses:  Anxiety    Return for intractable cough, coughing up blood,fevers >100.4 unrelieved by medication, shortness of breath, intractable vomiting, chest pain, shortness of breath, weakness,numbness, changes in speech, facial asymmetry,abdominal pain, passing out,Inability to tolerate liquids or food, cough, altered mental status or any concerns. No signs of systemic illness or infection. The patient is nontoxic-appearing on exam and vital signs are within normal limits.   I have reviewed the triage vital signs and the nursing notes. Pertinent labs &imaging results that were available during my care of the patient were reviewed by me and considered in my medical decision making (see chart for details).  After history, exam, and medical workup I feel the patient has been appropriately medically screened and is  safe for discharge home. Pertinent diagnoses were discussed with the patient. Patient was given return precautions   Gwenn Teodoro, MD 10/16/18 [phone]

## 2018-10-16 NOTE — ED Triage Notes (Signed)
States woke up feeling anxious and wanted to make sure his VS were ok

## 2018-11-28 ENCOUNTER — Other Ambulatory Visit: Payer: Self-pay

## 2018-11-28 ENCOUNTER — Encounter (HOSPITAL_BASED_OUTPATIENT_CLINIC_OR_DEPARTMENT_OTHER): Payer: Self-pay | Admitting: *Deleted

## 2018-11-28 DIAGNOSIS — F419 Anxiety disorder, unspecified: Secondary | ICD-10-CM | POA: Diagnosis not present

## 2018-11-28 DIAGNOSIS — Z5321 Procedure and treatment not carried out due to patient leaving prior to being seen by health care provider: Secondary | ICD-10-CM | POA: Insufficient documentation

## 2018-11-28 NOTE — ED Triage Notes (Signed)
Pt reports Hx of anxiety. Tonight his apple watch showed a heartrate in the 120s which made him anxious. States he just wants to get checked out so he can sleep tonight

## 2018-11-29 ENCOUNTER — Emergency Department (HOSPITAL_BASED_OUTPATIENT_CLINIC_OR_DEPARTMENT_OTHER)
Admission: EM | Admit: 2018-11-29 | Discharge: 2018-11-29 | Disposition: A | Discharge status: Home / Self Care | Payer: BLUE CROSS/BLUE SHIELD | Attending: Emergency Medicine | Admitting: Emergency Medicine

## 2018-11-29 HISTORY — DX: Gastro-esophageal reflux disease without esophagitis: K21.9

## 2018-11-29 HISTORY — DX: Irritable bowel syndrome, unspecified: K58.9

## 2018-11-29 NOTE — ED Notes (Signed)
Called pt x 3. Not found in either lobyy

## 2018-12-13 ENCOUNTER — Emergency Department (HOSPITAL_BASED_OUTPATIENT_CLINIC_OR_DEPARTMENT_OTHER)
Admission: EM | Admit: 2018-12-13 | Discharge: 2018-12-13 | Disposition: A | Discharge status: Home / Self Care | Payer: BLUE CROSS/BLUE SHIELD | Attending: Emergency Medicine | Admitting: Emergency Medicine

## 2018-12-13 ENCOUNTER — Other Ambulatory Visit: Payer: Self-pay

## 2018-12-13 ENCOUNTER — Encounter (HOSPITAL_BASED_OUTPATIENT_CLINIC_OR_DEPARTMENT_OTHER): Payer: Self-pay | Admitting: *Deleted

## 2018-12-13 DIAGNOSIS — F17210 Nicotine dependence, cigarettes, uncomplicated: Secondary | ICD-10-CM | POA: Insufficient documentation

## 2018-12-13 DIAGNOSIS — F419 Anxiety disorder, unspecified: Secondary | ICD-10-CM

## 2018-12-13 DIAGNOSIS — Z79899 Other long term (current) drug therapy: Secondary | ICD-10-CM | POA: Diagnosis not present

## 2018-12-13 DIAGNOSIS — R002 Palpitations: Secondary | ICD-10-CM | POA: Insufficient documentation

## 2018-12-13 NOTE — Discharge Instructions (Addendum)
You were seen today for palpitations.  Your symptoms resolved.  This may be related to taking trazodone tonight although you have taken it without incident previously.  Sometimes palpitations can also be related to anxiety.  Your EKG does not show any evidence of arrhythmia.  This is very reassuring.  Discontinue taking trazodone at home and follow-up with your primary physician regarding ongoing issues with insomnia.

## 2018-12-13 NOTE — ED Provider Notes (Signed)
[facility] EMERGENCY DEPARTMENT Provider Note   CSN: [id number] Arrival date & time: 12/13/18  0133     History   Chief Complaint Chief Complaint  Patient presents with  . anxiety    HPI Dalton Fuentes is a 43 y.o. male.     HPI  This is a 43 year old male with a history of anxiety, IBS, insomnia who presents with palpitations.  Patient reports that he was recently started on Lexapro.  He was advised by his psychiatrist to also reinitiate trazodone for sleep.  He has previously tolerated trazodone without issue.  Patient reports that he has significant issues with falling asleep.  He started Lexapro this past week at 5 mg.  He took it multiple days in a row without incident.  Tonight he had difficulty falling asleep and so subsequently took half of a trazodone.  Patient reports that 1 hour later he noted "my heart was beating out of my chest."  He did not noted any shortness of breath or chest pain at that time.  He did report that he felt anxious about his symptoms.  Denies any recent other medication changes.  He currently is asymptomatic and states "I feel much better."  Past Medical History:  Diagnosis Date  . Acid reflux   . Anxiety   . IBS (irritable bowel syndrome)   . Insomnia   . kidney stones   . Sleep apnea     There are no active problems to display for this patient.   Past Surgical History:  Procedure Laterality Date  . ESOPHAGOGASTRODUODENOSCOPY ENDOSCOPY          Home Medications    Prior to Admission medications   Medication Sig Start Date End Date Taking? Authorizing Provider  escitalopram (LEXAPRO) 5 MG tablet Take 5 mg by mouth daily.   Yes [provider]  traZODone (DESYREL) 50 MG tablet Take 25 mg by mouth at bedtime.   Yes [provider]  HYDROmorphone (DILAUDID) 4 MG tablet Take 0.5-1 tablets (2-4 mg total) by mouth every 4 (four) hours as needed for severe pain. 05/10/18   Molpus, John, MD  LORazepam (ATIVAN) 1  MG tablet Take 1 tablet (1 mg total) by mouth at bedtime as needed for anxiety or sleep. 09/07/18   Orpah Greek, MD  omeprazole (PRILOSEC) 40 MG capsule  04/19/18   [provider]  ondansetron (ZOFRAN ODT) 8 MG disintegrating tablet Take 1 tablet (8 mg total) by mouth every 8 (eight) hours as needed for nausea or vomiting. 05/10/18   Molpus, John, MD  tamsulosin (FLOMAX) 0.4 MG CAPS capsule Take 1 tablet daily until stone passes. 05/10/18   Molpus, John, MD    Family History No family history on file.  Social History Social History   Tobacco Use  . Smoking status: Current Every Day Smoker    Packs/day: 0.50    Types: Cigarettes  . Smokeless tobacco: Never Used  Substance Use Topics  . Alcohol use: No  . Drug use: Yes    Types: Marijuana     Allergies   Patient has no known allergies.   Review of Systems Review of Systems  Constitutional: Negative for fever.  Respiratory: Negative for shortness of breath.   Cardiovascular: Positive for palpitations. Negative for chest pain.  Gastrointestinal: Negative for abdominal pain, nausea and vomiting.  Psychiatric/Behavioral: The patient is nervous/anxious.   All other systems reviewed and are negative.    Physical Exam Updated Vital Signs BP Marland Kitchen)  146/97 (BP Location: Right Arm)   Pulse 83   Temp 97.7 F (36.5 C) (Oral)   Resp 20   Ht 1.727 m (5\' 8" )   Wt 88.5 kg   SpO2 99%   BMI 29.65 kg/m   Physical Exam Vitals signs and nursing note reviewed.  Constitutional:      Appearance: He is well-developed. He is obese.  HENT:     Head: Normocephalic and atraumatic.  Eyes:     Pupils: Pupils are equal, round, and reactive to light.  Neck:     Musculoskeletal: Neck supple.  Cardiovascular:     Rate and Rhythm: Normal rate and regular rhythm.     Heart sounds: Normal heart sounds. No murmur.  Pulmonary:     Effort: Pulmonary effort is normal. No respiratory distress.     Breath sounds: Normal breath  sounds. No wheezing.  Abdominal:     General: Bowel sounds are normal.     Palpations: Abdomen is soft.     Tenderness: There is no abdominal tenderness. There is no rebound.  Musculoskeletal:        General: No deformity.     Right lower leg: No edema.     Left lower leg: No edema.  Lymphadenopathy:     Cervical: No cervical adenopathy.  Skin:    General: Skin is warm and dry.  Neurological:     Mental Status: He is alert and oriented to person, place, and time.  Psychiatric:     Comments: Anxious but nontoxic      ED Treatments / Results  Labs (all labs ordered are listed, but only abnormal results are displayed) Labs Reviewed - No data to display  EKG EKG Interpretation  Date/Time:  Monday December 13 2018 02:01:35 EST Ventricular Rate:  68 PR Interval:    QRS Duration: 81 QT Interval:  386 QTC Calculation: 411 R Axis:   11 Text Interpretation: Sinus rhythm Prolonged PR interval Confirmed by [id number] (Ross Marcus) on 12/13/2018 2:07:44 AM   Radiology No results found.  Procedures Procedures (including critical care time)  Medications Ordered in ED Medications - No data to display   Initial Impression / Assessment and Plan / ED Course  I have reviewed the triage vital signs and the nursing notes.  Pertinent labs & imaging results that were available during my care of the patient were reviewed by me and considered in my medical decision making (see chart for details).        Patient presents with self-limited palpitations.  This was in the setting of taking trazodone for sleep.  He also recently started Lexapro.  He is overall nontoxic-appearing and vital signs are reassuring including a pulse rate of 83.  He is now asymptomatic.  EKG does not show any significant abnormality or arrhythmia.  Patient has been seen in the emergency department multiple times for anxiety and insomnia.  I discussed with the patient that his symptoms could be due to taking  trazodone; however, given that he has taken this before without issue, it could be multifactorial as well.  Given that he is now asymptomatic, I do not feel he needs further work-up.  Recommend discontinuing trazodone and follow-up with primary physician regarding ongoing issues with sleep.  After history, exam, and medical workup I feel the patient has been appropriately medically screened and is safe for discharge home. Pertinent diagnoses were discussed with the patient. Patient was given return precautions.   Final Clinical Impressions(s) / ED Diagnoses  Final diagnoses:  Palpitations  Anxiety    ED Discharge Orders    None       Keileigh Vahey, Mayer Maskerourtney F, MD 12/13/18 [phone]

## 2018-12-13 NOTE — ED Triage Notes (Addendum)
Pt states that he started on lexapro Wednesday and added Trazodone tonight. States after he took the dose of trazodone tonight he felt like his heart was racing. States he felt anxious. HR 73 on monitor. Denies any chest pain or sob.

## 2021-02-09 ENCOUNTER — Emergency Department (HOSPITAL_BASED_OUTPATIENT_CLINIC_OR_DEPARTMENT_OTHER)
Admission: EM | Admit: 2021-02-09 | Discharge: 2021-02-09 | Disposition: A | Discharge status: Home / Self Care | Payer: BLUE CROSS/BLUE SHIELD | Attending: Emergency Medicine | Admitting: Emergency Medicine

## 2021-02-09 ENCOUNTER — Other Ambulatory Visit: Payer: Self-pay

## 2021-02-09 ENCOUNTER — Encounter (HOSPITAL_BASED_OUTPATIENT_CLINIC_OR_DEPARTMENT_OTHER): Payer: Self-pay | Admitting: Emergency Medicine

## 2021-02-09 DIAGNOSIS — F17210 Nicotine dependence, cigarettes, uncomplicated: Secondary | ICD-10-CM | POA: Insufficient documentation

## 2021-02-09 DIAGNOSIS — J069 Acute upper respiratory infection, unspecified: Secondary | ICD-10-CM | POA: Insufficient documentation

## 2021-02-09 DIAGNOSIS — U071 COVID-19: Secondary | ICD-10-CM | POA: Insufficient documentation

## 2021-02-09 LAB — RESP PANEL BY RT-PCR (FLU A&B, COVID) ARPGX2
Influenza A by PCR: NEGATIVE
Influenza B by PCR: NEGATIVE
SARS Coronavirus 2 by RT PCR: POSITIVE — AB

## 2021-02-09 MED ORDER — AMOXICILLIN 500 MG PO CAPS: 500.0000 mg | ORAL_CAPSULE | Freq: Three times a day (TID) | ORAL | 0 refills | Status: DC

## 2021-02-09 MED ORDER — NIRMATRELVIR/RITONAVIR (PAXLOVID)TABLET: 3.0000 | ORAL_TABLET | Freq: Two times a day (BID) | ORAL | 0 refills | Status: AC

## 2021-02-09 NOTE — ED Triage Notes (Signed)
Pt arrives pov with c/o cough, fever and sore throat

## 2021-02-09 NOTE — Discharge Instructions (Addendum)
You were seen in the emergency department for fever cough and sore throat.  You were tested for COVID and flu and these are pending at time of discharge.  We are treating you for possible sinus infection with antibiotics.  Please drink plenty of fluids and use Tylenol and ibuprofen for pain and fever.  If your COVID or flu tests are positive you will need to isolate.  Please follow-up with your primary care doctor and return to the emergency department if any worsening or concerning symptoms.

## 2021-02-09 NOTE — ED Notes (Signed)
ED Provider at bedside. 

## 2021-02-09 NOTE — ED Provider Notes (Signed)
[facility] EMERGENCY DEPARTMENT Provider Note   CSN: [id number] Arrival date & time: 02/09/21  0754     History Chief Complaint  Patient presents with   Cough    Dalton Fuentes is a 45 y.o. male.  He is here with a complaint of upper respiratory infection symptoms.  Started with a scratchy throat and dry cough yesterday.  Today felt worse with some body aches and had a fever to 101.3.  No nausea vomiting diarrhea.  No sick contacts or recent travel has tried nothing for it  The history is provided by the patient.  URI Presenting symptoms: congestion, cough, fever, rhinorrhea and sore throat   Cough:    Cough characteristics:  Non-productive   Sputum characteristics:  Nondescript   Severity:  Moderate   Onset quality:  Gradual   Duration:  2 days   Timing:  Intermittent   Progression:  Unchanged   Chronicity:  New Associated symptoms: myalgias and sinus pain   Associated symptoms: no headaches       Past Medical History:  Diagnosis Date   Acid reflux    Anxiety    IBS (irritable bowel syndrome)    Insomnia    kidney stones    Sleep apnea     There are no problems to display for this patient.   Past Surgical History:  Procedure Laterality Date   ESOPHAGOGASTRODUODENOSCOPY ENDOSCOPY         History reviewed. No pertinent family history.  Social History   Tobacco Use   Smoking status: Every Day    Packs/day: 0.50    Types: Cigarettes   Smokeless tobacco: Never  Vaping Use   Vaping Use: Never used  Substance Use Topics   Alcohol use: No   Drug use: Yes    Types: Marijuana    Home Medications Prior to Admission medications   Medication Sig Start Date End Date Taking? Authorizing Provider  escitalopram (LEXAPRO) 5 MG tablet Take 5 mg by mouth daily.    [provider]  HYDROmorphone (DILAUDID) 4 MG tablet Take 0.5-1 tablets (2-4 mg total) by mouth every 4 (four) hours as needed for severe pain. 05/10/18   Molpus, John, MD   LORazepam (ATIVAN) 1 MG tablet Take 1 tablet (1 mg total) by mouth at bedtime as needed for anxiety or sleep. 09/07/18   Gilda Crease, MD  omeprazole (PRILOSEC) 40 MG capsule  04/19/18   [provider]  ondansetron (ZOFRAN ODT) 8 MG disintegrating tablet Take 1 tablet (8 mg total) by mouth every 8 (eight) hours as needed for nausea or vomiting. 05/10/18   Molpus, John, MD  tamsulosin (FLOMAX) 0.4 MG CAPS capsule Take 1 tablet daily until stone passes. 05/10/18   Molpus, John, MD  traZODone (DESYREL) 50 MG tablet Take 25 mg by mouth at bedtime.    [provider]    Allergies    Patient has no known allergies.  Review of Systems   Review of Systems  Constitutional:  Positive for fever.  HENT:  Positive for congestion, rhinorrhea, sinus pain and sore throat.   Eyes:  Negative for visual disturbance.  Respiratory:  Positive for cough.   Cardiovascular:  Negative for chest pain.  Gastrointestinal:  Negative for diarrhea and vomiting.  Genitourinary:  Negative for dysuria.  Musculoskeletal:  Positive for myalgias.  Skin:  Negative for rash.  Neurological:  Negative for headaches.   Physical Exam Updated Vital Signs BP 103/79 (BP Location: Left Arm)  Pulse 87    Temp 99.1 F (37.3 C) (Oral)    Resp 16    Ht 5\' 8"  (1.727 m)    Wt 90.7 kg    SpO2 100%    BMI 30.41 kg/m   Physical Exam Vitals and nursing note reviewed.  Constitutional:      Appearance: Normal appearance. He is well-developed.  HENT:     Head: Normocephalic and atraumatic.     Right Ear: Tympanic membrane normal.     Left Ear: Tympanic membrane normal.     Nose: Nose normal.     Mouth/Throat:     Mouth: Mucous membranes are moist.     Pharynx: Oropharynx is clear.  Eyes:     Conjunctiva/sclera: Conjunctivae normal.  Cardiovascular:     Rate and Rhythm: Normal rate and regular rhythm.  Pulmonary:     Effort: Pulmonary effort is normal.     Breath sounds: No wheezing, rhonchi or rales.   Musculoskeletal:     Cervical back: Neck supple.  Skin:    General: Skin is warm and dry.  Neurological:     General: No focal deficit present.     Mental Status: He is alert.     GCS: GCS eye subscore is 4. GCS verbal subscore is 5. GCS motor subscore is 6.     Gait: Gait normal.    ED Results / Procedures / Treatments   Labs (all labs ordered are listed, but only abnormal results are displayed) Labs Reviewed  RESP PANEL BY RT-PCR (FLU A&B, COVID) ARPGX2 - Abnormal; Notable for the following components:      Result Value   SARS Coronavirus 2 by RT PCR POSITIVE (*)    All other components within normal limits    EKG None  Radiology No results found.  Procedures Procedures   Medications Ordered in ED Medications - No data to display  ED Course  I have reviewed the triage vital signs and the nursing notes.  Pertinent labs & imaging results that were available during my care of the patient were reviewed by me and considered in my medical decision making (see chart for details).  Clinical Course as of 02/09/21 1731  Sat Feb 09, 2021  0936 Addendum, patient was discharged about an hour ago.  His COVID test came back positive.  I called him and informed him.  His GFR was normal about 10 months ago and he is only taking omeprazole so no contraindications to Paxlovid.  Prescription sent to pharmacy. [MB]    Clinical Course User Index [MB] Hayden Rasmussen, MD   MDM Rules/Calculators/A&P                         Dalton Fuentes was evaluated in Emergency Department on 02/09/2021 for the symptoms described in the history of present illness. He was evaluated in the context of the global COVID-19 pandemic, which necessitated consideration that the patient might be at risk for infection with the SARS-CoV-2 virus that causes COVID-19. Institutional protocols and algorithms that pertain to the evaluation of patients at risk for COVID-19 are in a state of rapid change based on  information released by regulatory bodies including the CDC and federal and state organizations. These policies and algorithms were followed during the patient's care in the ED.     Final Clinical Impression(s) / ED Diagnoses Final diagnoses:  Acute upper respiratory infection  COVID-19 virus infection    Rx /  DC Orders ED Discharge Orders          Ordered    amoxicillin (AMOXIL) 500 MG capsule  3 times daily        02/09/21 0825    nirmatrelvir/ritonavir EUA (PAXLOVID) 20 x 150 MG & 10 x 100MG  TABS  2 times daily        02/09/21 0936             Hayden Rasmussen, MD 02/09/21 [phone]

## 2023-11-20 ENCOUNTER — Encounter (HOSPITAL_BASED_OUTPATIENT_CLINIC_OR_DEPARTMENT_OTHER): Payer: Self-pay | Admitting: Emergency Medicine

## 2023-11-20 ENCOUNTER — Emergency Department (HOSPITAL_BASED_OUTPATIENT_CLINIC_OR_DEPARTMENT_OTHER)
Admission: EM | Admit: 2023-11-20 | Discharge: 2023-11-20 | Disposition: A | Discharge status: Home / Self Care | Payer: Self-pay | Attending: Emergency Medicine | Admitting: Emergency Medicine

## 2023-11-20 ENCOUNTER — Other Ambulatory Visit: Payer: Self-pay

## 2023-11-20 ENCOUNTER — Emergency Department (HOSPITAL_BASED_OUTPATIENT_CLINIC_OR_DEPARTMENT_OTHER): Payer: Self-pay

## 2023-11-20 DIAGNOSIS — F17200 Nicotine dependence, unspecified, uncomplicated: Secondary | ICD-10-CM | POA: Insufficient documentation

## 2023-11-20 DIAGNOSIS — R0602 Shortness of breath: Secondary | ICD-10-CM | POA: Insufficient documentation

## 2023-11-20 DIAGNOSIS — R072 Precordial pain: Secondary | ICD-10-CM | POA: Insufficient documentation

## 2023-11-20 LAB — CBC
HCT: 40.2 % (ref 39.0–52.0)
Hemoglobin: 14.3 g/dL (ref 13.0–17.0)
MCH: 31.1 pg (ref 26.0–34.0)
MCHC: 35.6 g/dL (ref 30.0–36.0)
MCV: 87.4 fL (ref 80.0–100.0)
Platelets: 218 K/uL (ref 150–400)
RBC: 4.6 MIL/uL (ref 4.22–5.81)
RDW: 12.5 % (ref 11.5–15.5)
WBC: 9.8 K/uL (ref 4.0–10.5)
nRBC: 0 % (ref 0.0–0.2)

## 2023-11-20 LAB — TROPONIN T, HIGH SENSITIVITY
Troponin T High Sensitivity: <15 ng/L (ref 0–19)
Troponin T High Sensitivity: <15 ng/L (ref 0–19)

## 2023-11-20 LAB — BASIC METABOLIC PANEL WITH GFR
Anion gap: 12 (ref 5–15)
BUN: 11 mg/dL (ref 6–20)
CO2: 24 mmol/L (ref 22–32)
Calcium: 9 mg/dL (ref 8.9–10.3)
Chloride: 102 mmol/L (ref 98–111)
Creatinine, Ser: 1.14 mg/dL (ref 0.61–1.24)
GFR, Estimated: >60 mL/min (ref >60)
Glucose, Bld: 105 mg/dL — ABNORMAL HIGH (ref 70–99)
Potassium: 3.7 mmol/L (ref 3.5–5.1)
Sodium: 137 mmol/L (ref 135–145)

## 2023-11-20 NOTE — ED Provider Notes (Signed)
 [facility] DE[facility] AT [facility] Provider Note   CSN: [id number] Arrival date & time: 11/20/23  9961     Patient presents with: Chest Pain   Dalton Fuentes is a 48 y.o. male.   The history is provided by the patient and the spouse.  Chest Pain Pain location:  Substernal area Pain quality: pressure   Pain radiates to:  L shoulder Pain severity:  Moderate Onset quality:  Gradual Duration:  1 day Progression:  Unchanged Chronicity:  New Worsened by:  Movement (leaning forward) Associated symptoms: shortness of breath   Associated symptoms: no abdominal pain, no back pain, no diaphoresis, no fever, no syncope, no vomiting and no weakness   Risk factors: male sex, obesity and smoking   Risk factors: no diabetes mellitus, no high cholesterol, no hypertension and no prior DVT/PE   Patient reports for the past day he has had chest pressure that has been consistent.  Also reports some left shoulder pain.  No diaphoresis, no vomiting He reports feeling mildly short of breath when talking on the phone.  No recent trauma.  No recent heavy lifting.  Patient does admit to smoking marijuana and tobacco regularly.  No known history of CAD.  Reports a family history of CAD in his father     Prior to Admission medications   Medication Sig Start Date End Date Taking? Authorizing Provider  escitalopram (LEXAPRO) 5 MG tablet Take 5 mg by mouth daily.    [provider]  traZODone (DESYREL) 50 MG tablet Take 25 mg by mouth at bedtime.    [provider]    Allergies: Patient has no known allergies.    Review of Systems  Constitutional:  Negative for diaphoresis and fever.  Respiratory:  Positive for shortness of breath.   Cardiovascular:  Positive for chest pain. Negative for syncope.  Gastrointestinal:  Negative for abdominal pain and vomiting.  Musculoskeletal:  Negative for back pain.  Neurological:  Negative for weakness.    Updated Vital  Signs BP (!) 123/90   Pulse 73   Temp 98 F (36.7 C)   Resp (!) 25   Ht 1.727 m (5' 8)   Wt 95.3 kg   SpO2 98%   BMI 31.93 kg/m   Physical Exam CONSTITUTIONAL: Well developed/well nourished, mildly anxious HEAD: Normocephalic/atraumatic EYES: EOMI ENMT: Mucous membranes moist NECK: supple no meningeal signs CV: S1/S2 noted, no murmurs/rubs/gallops noted LUNGS: Lungs are clear to auscultation bilaterally, no apparent distress ABDOMEN: soft, nontender, obese, no RUQ Tenderness NEURO: Pt is awake/alert/appropriate, moves all extremitiesx4.  No facial droop.   EXTREMITIES: pulses normal/equal, full ROM, no calf tenderness or edema SKIN: warm, color normal PSYCH: anxious  (all labs ordered are listed, but only abnormal results are displayed) Labs Reviewed  BASIC METABOLIC PANEL WITH GFR - Abnormal; Notable for the following components:      Result Value   Glucose, Bld 105 (*)    All other components within normal limits  CBC  TROPONIN T, HIGH SENSITIVITY  TROPONIN T, HIGH SENSITIVITY    EKG: EKG Interpretation Date/Time:  Friday November 20 2023 00:48:32 EDT Ventricular Rate:  75 PR Interval:  219 QRS Duration:  83 QT Interval:  368 QTC Calculation: 411 R Axis:   29  Text Interpretation: Sinus rhythm Prolonged PR interval No significant change since last tracing Confirmed by Midge Golas ([id number]) on 11/20/2023 12:52:40 AM  Radiology: DG Chest 2 View Result Date: 11/20/2023 CLINICAL DATA:  chest pain  c/o intermittent all over chest pain that started yesterday with left shoulder pain. EXAM: CHEST - 2 VIEW COMPARISON:  Chest x-ray 12/02/2015 FINDINGS: The heart and mediastinal contours are within normal limits. No focal consolidation. No pulmonary edema. No pleural effusion. No pneumothorax. No acute osseous abnormality. IMPRESSION: No active cardiopulmonary disease. Electronically Signed   By: Morgane  Naveau M.D.   On: 11/20/2023 01:15     Procedures    Medications Ordered in the ED - No data to display  Clinical Course as of 11/20/23 0314  Fri Nov 20, 2023  [id number] Patient presents with chest pain.  He is feeling improved.  Workup was been unremarkable.  I have low suspicion for ACS/PE/dissection at this time.  Will refer to cardiology.  We discussed strict ER return precautions. [DW]    Clinical Course User Index [DW] Midge Golas, MD              HEART Score: 3                    Medical Decision Making Amount and/or Complexity of Data Reviewed Labs: ordered. Radiology: ordered.   This patient presents to the ED for concern of chest pain, this involves an extensive number of treatment options, and is a complaint that carries with it a high risk of complications and morbidity.  The differential diagnosis includes but is not limited to acute coronary syndrome, aortic dissection, pulmonary embolism, pericarditis, pneumothorax, pneumonia, myocarditis, pleurisy, esophageal rupture   Comorbidities that complicate the patient evaluation: Patient's presentation is complicated by their history of obesity  Social Determinants of Health: Patient's tobacco use  increases the complexity of managing their presentation  Additional history obtained: Additional history obtained from spouse  Lab Tests: I Ordered, and personally interpreted labs.  The pertinent results include: Labs overall reassuring  Imaging Studies ordered: I ordered imaging studies including X-ray chest  I independently visualized and interpreted imaging which showed no acute findings I agree with the radiologist interpretation  Cardiac Monitoring: The patient was maintained on a cardiac monitor.  I personally viewed and interpreted the cardiac monitor which showed an underlying rhythm of:  sinus rhythm  Test Considered: Patient is low risk / negative by heart score, therefore do not feel that cardiac admission is indicated.  Reevaluation: After the  interventions noted above, I reevaluated the patient and found that they have :improved  Complexity of problems addressed: Patient's presentation is most consistent with  acute presentation with potential threat to life or bodily function  Disposition: After consideration of the diagnostic results and the patient's response to treatment,  I feel that the patent would benefit from discharge  .       Final diagnoses:  Precordial pain    ED Discharge Orders     None          Midge Golas, MD 11/20/23 [phone]

## 2023-11-20 NOTE — ED Notes (Signed)
 ED Provider at bedside.

## 2023-11-20 NOTE — ED Triage Notes (Signed)
 Pt c/o intermittent all over chest pain that started yesterday with left shoulder pain.

## 2023-11-20 NOTE — Discharge Instructions (Signed)
